# Patient Record
Sex: Female | Born: 1985 | Race: White | Hispanic: No | Marital: Single | Smoking: Never smoker
Health system: Southern US, Community
[De-identification: ages and names within clinical notes are randomized; demographics above are authoritative.]

## PROBLEM LIST (undated history)

## (undated) HISTORY — PX: TONSILLECTOMY: SUR1361

## (undated) HISTORY — PX: CYST REMOVAL HAND: SHX6279

---
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
## 2006-01-09 ENCOUNTER — Other Ambulatory Visit: Admission: RE | Admit: 2006-01-09 | Discharge: 2006-01-09 | Payer: Self-pay | Admitting: Family Medicine

## 2007-02-19 ENCOUNTER — Other Ambulatory Visit: Admission: RE | Admit: 2007-02-19 | Discharge: 2007-02-19 | Payer: Self-pay | Admitting: Family Medicine

## 2008-03-04 ENCOUNTER — Other Ambulatory Visit: Admission: RE | Admit: 2008-03-04 | Discharge: 2008-03-04 | Payer: Self-pay | Admitting: Family Medicine

## 2012-05-29 ENCOUNTER — Encounter: Payer: Self-pay | Admitting: Certified Nurse Midwife

## 2012-06-03 ENCOUNTER — Ambulatory Visit (INDEPENDENT_AMBULATORY_CARE_PROVIDER_SITE_OTHER): Payer: BC Managed Care – PPO | Admitting: Certified Nurse Midwife

## 2012-06-03 ENCOUNTER — Encounter: Payer: Self-pay | Admitting: Certified Nurse Midwife

## 2012-06-03 VITALS — BP 110/64 | Ht 67.25 in | Wt 153.0 lb

## 2012-06-03 DIAGNOSIS — Z01419 Encounter for gynecological examination (general) (routine) without abnormal findings: Secondary | ICD-10-CM

## 2012-06-03 MED ORDER — ETONOGESTREL-ETHINYL ESTRADIOL 0.12-0.015 MG/24HR VA RING: VAGINAL_RING | VAGINAL | Status: DC

## 2012-06-03 NOTE — Patient Instructions (Signed)

## 2012-06-03 NOTE — Progress Notes (Signed)
27 y.o. SingleCaucasian female.    [id number] here for annual exam. Reports no problems with contraception or periods.  No concerns STD's or testing desired. No health issues today.  Working now, finished degree.   Patient's last menstrual period was 05/11/2012.          Sexually active: yes  The current method of family planning is NuvaRing vaginal inserts.    Exercising: yes  cycling Last mammogram: none Last pap :04/28/2010 Last BMD: none Alcohol: 2-3 drinks a week Tobacco: none   Health Maintenance  Topic Date Due  . Influenza Vaccine  11/12/1986  . Pap Smear  10/03/2004  . Tetanus/tdap  03/13/2018    Family History  Problem Relation Age of Onset  . Diabetes Maternal Grandmother     There is no problem list on file for this patient.   History reviewed. No pertinent past medical history.  Past Surgical History  Procedure Laterality Date  . Cyst removal hand Left     age 74  . Tonsillectomy      age 54    Allergies: Keflex and Penicillins  Current Outpatient Prescriptions  Medication Sig Dispense Refill  . etonogestrel-ethinyl estradiol (NUVARING) 0.12-0.015 MG/24HR vaginal ring Place 1 each vaginally every 28 (twenty-eight) days. Insert vaginally and leave in place for 3 consecutive weeks, then remove for 1 week.      . Multiple Vitamins-Minerals (MULTIVITAMIN PO) Take by mouth.      . sertraline (ZOLOFT) 100 MG tablet Take 100 mg by mouth daily.       No current facility-administered medications for this visit.    ROS: A comprehensive review of systems was negative.  Exam:    BP 110/64  Ht 5' 7.25" (1.708 m)  Wt 153 lb (69.4 kg)  BMI 23.79 kg/m2  LMP 05/11/2012 Weight change: @WEIGHTCHANGE @ Last 3 height recordings:  Ht Readings from Last 3 Encounters:  06/03/12 5' 7.25" (1.708 m)   General appearance: alert and cooperative Head: Normocephalic, without obvious abnormality, atraumatic Neck: no adenopathy, supple, symmetrical, trachea midline and thyroid  not enlarged, symmetric, no tenderness/mass/nodules Lungs: clear to auscultation bilaterally Breasts: normal appearance, no masses or tenderness, Inspection negative Heart: regular rate and rhythm Abdomen: soft, non-tender; bowel sounds normal; no masses,  no organomegaly Extremities: extremities normal, atraumatic, no cyanosis or edema Skin: Skin color, texture, turgor normal. No rashes or lesions Lymph nodes: Cervical, supraclavicular, and axillary nodes normal. no inguinal nodes palpated Neurologic: Alert and oriented X 3, normal strength and tone. Normal symmetric reflexes. Normal coordination and gait   Pelvic: External genitalia:  no lesions              Urethra: not indicated and normal appearing urethra with no masses, tenderness or lesions              Bartholins and Skenes: Bartholin's, Urethra, Skene's normal                 Vagina: normal appearing vagina with normal color and discharge, no lesions              Cervix: normal appearance              Pap taken: no        Bimanual Exam:  Uterus:  uterus is normal size, shape, consistency and nontender, anteverted  Adnexa:    not indicated and normal adnexa in size, nontender and no masses                                      Rectovaginal: Confirms                                      Anus:  defer exam  A:1- Well woman exam Contraception- Nuvaring desired     P:Pap smear Rx Nuvaring x12 refills with instructions warning signs and symptoms given return annually or prn  Instructions given  EXERCISE AND DIET:  We recommended that you start or continue a regular exercise program for good health. Regular exercise means any activity that makes your heart beat faster and makes you sweat.  We recommend exercising at least 30 minutes per day at least 3 days a week, preferably 4 or 5.  We also recommend a diet low in fat and sugar.  Inactivity, poor dietary choices and obesity can cause diabetes,  heart attack, stroke, and kidney damage, among others.    ALCOHOL AND SMOKING:  Women should limit their alcohol intake to no more than 7 drinks/beers/glasses of wine (combined, not each!) per week. Moderation of alcohol intake to this level decreases your risk of breast cancer and liver damage. And of course, no recreational drugs are part of a healthy lifestyle.  And absolutely no smoking or even second hand smoke. Most people know smoking can cause heart and lung diseases, but did you know it also contributes to weakening of your bones? Aging of your skin?  Yellowing of your teeth and nails?  CALCIUM AND VITAMIN D:  Adequate intake of calcium and Vitamin D are recommended.  The recommendations for exact amounts of these supplements seem to change often, but generally speaking 600 mg of calcium (either carbonate or citrate) and 800 units of Vitamin D per day seems prudent. Certain women may benefit from higher intake of Vitamin D.  If you are among these women, your doctor will have told you during your visit.    PAP SMEARS:  Pap smears, to check for cervical cancer or precancers,  have traditionally been done yearly, although recent scientific advances have shown that most women can have pap smears less often.  However, every woman still should have a physical exam from her gynecologist every year. It will include a breast check, inspection of the vulva and vagina to check for abnormal growths or skin changes, a visual exam of the cervix, and then an exam to evaluate the size and shape of the uterus and ovaries.  And after 27 years of age, a rectal exam is indicated to check for rectal cancers. We will also provide age appropriate advice regarding health maintenance, like when you should have certain vaccines, screening for sexually transmitted diseases, bone density testing, colonoscopy, mammograms, etc.   MAMMOGRAMS:  All women over 54 years old should have a yearly mammogram. Many facilities now offer  a "3D" mammogram, which may cost around $50 extra out of pocket. If possible,  we recommend you accept the option to have the 3D mammogram performed.  It both reduces the number of women who will be called back for extra views which then turn out to be normal, and it is better than the routine mammogram at detecting  truly abnormal areas.    COLONOSCOPY:  Colonoscopy to screen for colon cancer is recommended for all women at age 73.  We know, you hate the idea of the prep.  We agree, BUT, having colon cancer and not knowing it is worse!!  Colon cancer so often starts as a polyp that can be seen and removed at colonscopy, which can quite literally save your life!  And if your first colonoscopy is normal and you have no family history of colon cancer, most women don't have to have it again for 10 years.  Once every ten years, you can do something that may end up saving your life, right?  We will be happy to help you get it scheduled when you are ready.  Be sure to check your insurance coverage so you understand how much it will cost.  It may be covered as a preventative service at no cost, but you should check your particular policy.       An After Visit Summary was printed and given to the patient.

## 2013-06-04 ENCOUNTER — Ambulatory Visit: Payer: BC Managed Care – PPO | Admitting: Certified Nurse Midwife

## 2013-06-11 ENCOUNTER — Encounter: Payer: Self-pay | Admitting: Certified Nurse Midwife

## 2013-06-11 ENCOUNTER — Ambulatory Visit (INDEPENDENT_AMBULATORY_CARE_PROVIDER_SITE_OTHER): Payer: BC Managed Care – PPO | Admitting: Certified Nurse Midwife

## 2013-06-11 VITALS — BP 122/80 | HR 74 | Resp 12 | Ht 67.0 in | Wt 166.0 lb

## 2013-06-11 DIAGNOSIS — Z01419 Encounter for gynecological examination (general) (routine) without abnormal findings: Secondary | ICD-10-CM

## 2013-06-11 DIAGNOSIS — Z309 Encounter for contraceptive management, unspecified: Secondary | ICD-10-CM

## 2013-06-11 DIAGNOSIS — Z0000 Encounter for general adult medical examination without abnormal findings: Secondary | ICD-10-CM

## 2013-06-11 MED ORDER — ETONOGESTREL-ETHINYL ESTRADIOL 0.12-0.015 MG/24HR VA RING: VAGINAL_RING | VAGINAL | Status: DC

## 2013-06-11 NOTE — Patient Instructions (Signed)
General topics  Next pap or exam is  due in 1 year Take a Women's multivitamin Take 1200 mg. of calcium daily - prefer dietary If any concerns in interim to call back  Breast Self-Awareness Practicing breast self-awareness may pick up problems early, prevent significant medical complications, and possibly save your life. By practicing breast self-awareness, you can become familiar with how your breasts look and feel and if your breasts are changing. This allows you to notice changes early. It can also offer you some reassurance that your breast health is good. One way to learn what is normal for your breasts and whether your breasts are changing is to do a breast self-exam. If you find a lump or something that was not present in the past, it is best to contact your caregiver right away. Other findings that should be evaluated by your caregiver include nipple discharge, especially if it is bloody; skin changes or reddening; areas where the skin seems to be pulled in (retracted); or new lumps and bumps. Breast pain is seldom associated with cancer (malignancy), but should also be evaluated by a caregiver. BREAST SELF-EXAM The best time to examine your breasts is 5 7 days after your menstrual period is over.  ExitCare Patient Information 2013 [employer].   Exercise to Stay Healthy Exercise helps you become and stay healthy. EXERCISE IDEAS AND TIPS Choose exercises that:  You enjoy.  Fit into your day. You do not need to exercise really hard to be healthy. You can do exercises at a slow or medium level and stay healthy. You can:  Stretch before and after working out.  Try yoga, Pilates, or tai chi.  Lift weights.  Walk fast, swim, jog, run, climb stairs, bicycle, dance, or rollerskate.  Take aerobic classes. Exercises that burn about 150 calories:  Running 1  miles in 15 minutes.  Playing volleyball for 45 to 60 minutes.  Washing and waxing a car for 45 to 60  minutes.  Playing touch football for 45 minutes.  Walking 1  miles in 35 minutes.  Pushing a stroller 1  miles in 30 minutes.  Playing basketball for 30 minutes.  Raking leaves for 30 minutes.  Bicycling 5 miles in 30 minutes.  Walking 2 miles in 30 minutes.  Dancing for 30 minutes.  Shoveling snow for 15 minutes.  Swimming laps for 20 minutes.  Walking up stairs for 15 minutes.  Bicycling 4 miles in 15 minutes.  Gardening for 30 to 45 minutes.  Jumping rope for 15 minutes.  Washing windows or floors for 45 to 60 minutes. Document Released: 04/01/2010 Document Revised: 05/22/2011 Document Reviewed: 04/01/2010 [employer] Patient Information 2013 [employer].   Other topics ( that may be useful information):    Sexually Transmitted Disease Sexually transmitted disease (STD) refers to any infection that is passed from person to person during sexual activity. This may happen by way of saliva, semen, blood, vaginal mucus, or urine. Common STDs include:  Gonorrhea.  Chlamydia.  Syphilis.  HIV/AIDS.  Genital herpes.  Hepatitis B and C.  Trichomonas.  Human papillomavirus (HPV).  Pubic lice. CAUSES  An STD may be spread by bacteria, virus, or parasite. A person can get an STD by:  Sexual intercourse with an infected person.  Sharing sex toys with an infected person.  Sharing needles with an infected person.  Having intimate contact with the genitals, mouth, or rectal areas of an infected person. SYMPTOMS  Some people may not have any symptoms, but   not have any symptoms, but they can still pass the infection to others. Different STDs have different symptoms. Symptoms include:  Painful or bloody urination.  Pain in the pelvis, abdomen, vagina, anus, throat, or eyes.  Skin rash, itching, irritation, growths, or sores (lesions). These usually occur in the genital or anal area.  Abnormal vaginal discharge.  Penile discharge in men.  Soft, flesh-colored skin growths in the  genital or anal area.  Fever.  Pain or bleeding during sexual intercourse.  Swollen glands in the groin area.  Yellow skin and eyes (jaundice). This is seen with hepatitis. DIAGNOSIS  To make a diagnosis, your caregiver may:  Take a medical history.  Perform a physical exam.  Take a specimen (culture) to be examined.  Examine a sample of discharge under a microscope.  Perform blood test TREATMENT   Chlamydia, gonorrhea, trichomonas, and syphilis can be cured with antibiotic medicine.  Genital herpes, hepatitis, and HIV can be treated, but not cured, with prescribed medicines. The medicines will lessen the symptoms.  Genital warts from HPV can be treated with medicine or by freezing, burning (electrocautery), or surgery. Warts may come back.  HPV is a virus and cannot be cured with medicine or surgery.However, abnormal areas may be followed very closely by your caregiver and may be removed from the cervix, vagina, or vulva through office procedures or surgery. If your diagnosis is confirmed, your recent sexual partners need treatment. This is true even if they are symptom-free or have a negative culture or evaluation. They should not have sex until their caregiver says it is okay. HOME CARE INSTRUCTIONS  All sexual partners should be informed, tested, and treated for all STDs.  Take your antibiotics as directed. Finish them even if you start to feel better.  Only take over-the-counter or prescription medicines for pain, discomfort, or fever as directed by your caregiver.  Rest.  Eat a balanced diet and drink enough fluids to keep your urine clear or pale yellow.  Do not have sex until treatment is completed and you have followed up with your caregiver. STDs should be checked after treatment.  Keep all follow-up appointments, Pap tests, and blood tests as directed by your caregiver.  Only use latex condoms and water-soluble lubricants during sexual activity. Do not use  petroleum jelly or oils.  Avoid alcohol and illegal drugs.  Get vaccinated for HPV and hepatitis. If you have not received these vaccines in the past, talk to your caregiver about whether one or both might be right for you.  Avoid risky sex practices that can break the skin. The only way to avoid getting an STD is to avoid all sexual activity.Latex condoms and dental dams (for oral sex) will help lessen the risk of getting an STD, but will not completely eliminate the risk. SEEK MEDICAL CARE IF:   You have a fever.  You have any new or worsening symptoms. Document Released: 05/20/2002 Document Revised: 05/22/2011 Document Reviewed: 05/27/2010 [facility] Patient Information 2013 [place].    Domestic Abuse You are being battered or abused if someone close to you hits, pushes, or physically hurts you in any way. You also are being abused if you are forced into activities. You are being sexually abused if you are forced to have sexual contact of any kind. You are being emotionally abused if you are made to feel worthless or if you are constantly threatened. It is important to remember that help is available. No one has the right to  ABUSE  Learn the warning signs of danger. This varies with situations but may include: the use of alcohol, threats, isolation from friends and family, or forced sexual contact. Leave if you feel that violence is going to occur.  If you are attacked or beaten, report it to the police so the abuse is documented. You do not have to press charges. The police can protect you while you or the attackers are leaving. Get the officer's name and badge number and a copy of the report.  Find someone you can trust and tell them what is happening to you: your caregiver, a nurse, clergy member, close friend or family member. Feeling ashamed is natural, but remember that you have done nothing wrong. No one deserves abuse. Document Released:  02/25/2000 Document Revised: 05/22/2011 Document Reviewed: 05/05/2010 [employer] Patient Information 2013 [employer].    How Much is Too Much Alcohol? Drinking too much alcohol can cause injury, accidents, and health problems. These types of problems can include:   Car crashes.  Falls.  Family fighting (domestic violence).  Drowning.  Fights.  Injuries.  Burns.  Damage to certain organs.  Having a baby with birth defects. ONE DRINK CAN BE TOO MUCH WHEN YOU ARE:  Working.  Pregnant or breastfeeding.  Taking medicines. Ask your doctor.  Driving or planning to drive. If you or someone you know has a drinking problem, get help from a doctor.  Document Released: 12/24/2008 Document Revised: 05/22/2011 Document Reviewed: 12/24/2008 [employer] Patient Information 2013 [employer].   Smoking Hazards Smoking cigarettes is extremely bad for your health. Tobacco smoke has over 200 known poisons in it. There are over 60 chemicals in tobacco smoke that cause cancer. Some of the chemicals found in cigarette smoke include:   Cyanide.  Benzene.  Formaldehyde.  Methanol (wood alcohol).  Acetylene (fuel used in welding torches).  Ammonia. Cigarette smoke also contains the poisonous gases nitrogen oxide and carbon monoxide.  Cigarette smokers have an increased risk of many serious medical problems and Smoking causes approximately:  90% of all lung cancer deaths in men.  80% of all lung cancer deaths in women.  90% of deaths from chronic obstructive lung disease. Compared with nonsmokers, smoking increases the risk of:  Coronary heart disease by 2 to 4 times.  Stroke by 2 to 4 times.  Men developing lung cancer by 23 times.  Women developing lung cancer by 13 times.  Dying from chronic obstructive lung diseases by 12 times.  . Smoking is the most preventable cause of death and disease in our society.  WHY IS SMOKING ADDICTIVE?  Nicotine is the chemical  agent in tobacco that is capable of causing addiction or dependence.  When you smoke and inhale, nicotine is absorbed rapidly into the bloodstream through your lungs. Nicotine absorbed through the lungs is capable of creating a powerful addiction. Both inhaled and non-inhaled nicotine may be addictive.  Addiction studies of cigarettes and spit tobacco show that addiction to nicotine occurs mainly during the teen years, when young people begin using tobacco products. WHAT ARE THE BENEFITS OF QUITTING?  There are many health benefits to quitting smoking.   Likelihood of developing cancer and heart disease decreases. Health improvements are seen almost immediately.  Blood pressure, pulse rate, and breathing patterns start returning to normal soon after quitting. QUITTING SMOKING   American Lung Association - 1-800-LUNGUSA  American Cancer Society - 1-800-ACS-2345 Document Released: 04/06/2004 Document Revised: 05/22/2011 Document Reviewed: 12/09/2008 [employer] Patient Information 2013 [employer].   Stress Management Stress is a state of physical or mental tension that often results from changes in your life or normal routine. Some common causes of stress are:  Death of a loved one.  Injuries or severe illnesses.  Getting fired or changing jobs.  Moving into a new home. Other causes may be:  Sexual problems.  Business or financial losses.  Taking on a large debt.  Regular conflict with someone at home or at work.  Constant tiredness from lack of sleep. It is not just bad things that are stressful. It may be stressful to:  Win the lottery.  Get married.  Buy a new car. The amount of stress that can be easily tolerated varies from person to person. Changes generally cause stress, regardless of the types of change. Too much stress can affect your health. It may lead to physical or emotional problems. Too little stress (boredom) may also become stressful. SUGGESTIONS TO  REDUCE STRESS:  Talk things over with your family and friends. It often is helpful to share your concerns and worries. If you feel your problem is serious, you may want to get help from a professional counselor.  Consider your problems one at a time instead of lumping them all together. Trying to take care of everything at once may seem impossible. List all the things you need to do and then start with the most important one. Set a goal to accomplish 2 or 3 things each day. If you expect to do too many in a single day you will naturally fail, causing you to feel even more stressed.  Do not use alcohol or drugs to relieve stress. Although you may feel better for a short time, they do not remove the problems that caused the stress. They can also be habit forming.  Exercise regularly - at least 3 times per week. Physical exercise can help to relieve that "uptight" feeling and will relax you.  The shortest distance between despair and hope is often a good night's sleep.  Go to bed and get up on time allowing yourself time for appointments without being rushed.  Take a short "time-out" period from any stressful situation that occurs during the day. Close your eyes and take some deep breaths. Starting with the muscles in your face, tense them, hold it for a few seconds, then relax. Repeat this with the muscles in your neck, shoulders, hand, stomach, back and legs.  Take good care of yourself. Eat a balanced diet and get plenty of rest.  Schedule time for having fun. Take a break from your daily routine to relax. HOME CARE INSTRUCTIONS   Call if you feel overwhelmed by your problems and feel you can no longer manage them on your own.  Return immediately if you feel like hurting yourself or someone else. Document Released: 08/23/2000 Document Revised: 05/22/2011 Document Reviewed: 04/15/2007 Exit[employer] Patient Information 2013 [employer].   

## 2013-06-11 NOTE — Progress Notes (Signed)
28 y.o. G0P0000 Single Caucasian Fe here for annual exam. Periods normal, using Nuvaring continously then cylces with bleeding every 3 months. New partner, STD screening desired.  Patient's last menstrual period was 04/21/2013.          Sexually active: yes  The current method of family planning is NuvaRing vaginal inserts.    Exercising: yes  cycling 3-4x/wk Smoker:  no  Health Maintenance: Pap:  2012 TDaP:  2012 Labs: Sigmund HazelLisa Miller, MD ; Urine: Declined   reports that she has never smoked. She does not have any smokeless tobacco history on file. She reports that she drinks about 1.0 ounces of alcohol per week.  History reviewed. No pertinent past medical history.  Past Surgical History  Procedure Laterality Date  . Cyst removal hand Left     age 28  . Tonsillectomy      age 89    Current Outpatient Prescriptions  Medication Sig Dispense Refill  . FLUoxetine (PROZAC) 40 MG capsule Take 40 mg by mouth daily.      . Multiple Vitamins-Minerals (MULTIVITAMIN PO) Take by mouth.      . etonogestrel-ethinyl estradiol (NUVARING) 0.12-0.015 MG/24HR vaginal ring Insert vaginally and leave in place for 3 consecutive weeks, then remove for 1 week.  1 each  12   No current facility-administered medications for this visit.    Family History  Problem Relation Age of Onset  . Diabetes Maternal Grandmother     ROS:  Pertinent items are noted in HPI.  Otherwise, a comprehensive ROS was negative.  Exam:   LMP 04/21/2013    Ht Readings from Last 3 Encounters:  06/03/12 5' 7.25" (1.708 m)    General appearance: alert, cooperative and appears stated age Head: Normocephalic, without obvious abnormality, atraumatic Neck: no adenopathy, supple, symmetrical, trachea midline and thyroid normal to inspection and palpation and non-palpable Lungs: clear to auscultation bilaterally Breasts: normal appearance, no masses or tenderness, No nipple retraction or dimpling, No nipple discharge or  bleeding, No axillary or supraclavicular adenopathy Heart: regular rate and rhythm Abdomen: soft, non-tender; no masses,  no organomegaly Extremities: extremities normal, atraumatic, no cyanosis or edema Skin: Skin color, texture, turgor normal. No rashes or lesions Lymph nodes: Cervical, supraclavicular, and axillary nodes normal. No abnormal inguinal nodes palpated Neurologic: Grossly normal   Pelvic: External genitalia:  no lesions              Urethra:  normal appearing urethra with no masses, tenderness or lesions              Bartholin's and Skene's: normal                 Vagina: normal appearing vagina with normal color and discharge, no lesions              Cervix: normal, non tender bleeding with pap only              Pap taken: yes Bimanual Exam:  Uterus:  normal size, contour, position, consistency, mobility, non-tender and anteverted              Adnexa: normal adnexa and no mass, fullness, tenderness               Rectovaginal: Confirms               Anus:  Deferred  A:  Well Woman with normal exam  Contraception Nuvaring desired  STD screening  Anxiety/depression on medication with PCP management  Counseling  needed, using alcohol to self medicate with occasional marijuana use  P:   Reviewed health and wellness pertinent to exam  Rx Niuvaring see order  Lab: GC,Chlamydia,HIV,RPR  Discussed notifying PCP of status change and medication not working as well  Given J.Whitt information and stressed importance of calling. Discussed self  medication and danger of using other things with Prozac. Patient aware, denies thought of self harm or others. Instructed to seek 911 if occurs.  Lab:TSH, Vitamin D  Pap smear as per guidelines   pap smear taken today with reflex  counseled on breast self exam, STD prevention, HIV risk factors and prevention, adequate intake of calcium and vitamin D, diet and exercise  return annually or prn  An After Visit Summary was printed and  given to the patient.

## 2013-06-12 LAB — TSH: TSH: 2.035 u[IU]/mL (ref 0.350–4.500)

## 2013-06-12 LAB — IPS PAP TEST WITH REFLEX TO HPV

## 2013-06-12 LAB — HIV ANTIBODY (ROUTINE TESTING W REFLEX): HIV: NONREACTIVE

## 2013-06-12 LAB — VITAMIN D 25 HYDROXY (VIT D DEFICIENCY, FRACTURES): VIT D 25 HYDROXY: 26 ng/mL — AB (ref 30–89)

## 2013-06-12 LAB — RPR

## 2013-06-13 LAB — IPS N GONORRHOEA AND CHLAMYDIA BY PCR

## 2013-06-16 ENCOUNTER — Telehealth: Payer: Self-pay

## 2013-06-16 NOTE — Telephone Encounter (Signed)
Message copied by Eliezer BottomJOHNSON, DAVINA J on Mon Jun 16, 2013 10:12 AM ------      Message from: Ria CommentGRUBB, PATRICIA R      Created: Mon Jun 16, 2013  8:38 AM       Let patient know HIV,STS, GC, CHL is all normal.  Pap 02.  Also TSH is normal but Vit D is low - follow protocol. ------

## 2013-06-16 NOTE — Telephone Encounter (Signed)
No voicemail try later

## 2013-06-17 NOTE — Telephone Encounter (Signed)
Left message for call back.

## 2013-06-17 NOTE — Progress Notes (Signed)
Reviewed personally.  M. Suzanne Cheyan Frees, MD.  

## 2013-06-20 NOTE — Telephone Encounter (Signed)
Patient notified of results. See lab 

## 2014-06-16 ENCOUNTER — Other Ambulatory Visit: Payer: Self-pay | Admitting: Certified Nurse Midwife

## 2014-06-16 NOTE — Telephone Encounter (Signed)
Medication refill request: Nuvaring  Last AEX:  06/11/13 with DL  Next AEX: 1/6/104/7/16 with DL  Last MMG (if hormonal medication request): N/A Refill authorized: #1 ring/0 rfs, please advise.

## 2014-06-18 ENCOUNTER — Ambulatory Visit (INDEPENDENT_AMBULATORY_CARE_PROVIDER_SITE_OTHER): Payer: PRIVATE HEALTH INSURANCE | Admitting: Certified Nurse Midwife

## 2014-06-18 ENCOUNTER — Encounter: Payer: Self-pay | Admitting: Certified Nurse Midwife

## 2014-06-18 VITALS — BP 112/70 | HR 70 | Resp 16 | Ht 67.25 in | Wt 175.0 lb

## 2014-06-18 DIAGNOSIS — Z01419 Encounter for gynecological examination (general) (routine) without abnormal findings: Secondary | ICD-10-CM

## 2014-06-18 DIAGNOSIS — Z3049 Encounter for surveillance of other contraceptives: Secondary | ICD-10-CM | POA: Diagnosis not present

## 2014-06-18 MED ORDER — ETONOGESTREL-ETHINYL ESTRADIOL 0.12-0.015 MG/24HR VA RING: VAGINAL_RING | VAGINAL | Status: DC

## 2014-06-18 NOTE — Patient Instructions (Signed)
General topics  Next pap or exam is  due in 1 year Take a Women's multivitamin Take 1200 mg. of calcium daily - prefer dietary If any concerns in interim to call back  Breast Self-Awareness Practicing breast self-awareness may pick up problems early, prevent significant medical complications, and possibly save your life. By practicing breast self-awareness, you can become familiar with how your breasts look and feel and if your breasts are changing. This allows you to notice changes early. It can also offer you some reassurance that your breast health is good. One way to learn what is normal for your breasts and whether your breasts are changing is to do a breast self-exam. If you find a lump or something that was not present in the past, it is best to contact your caregiver right away. Other findings that should be evaluated by your caregiver include nipple discharge, especially if it is bloody; skin changes or reddening; areas where the skin seems to be pulled in (retracted); or new lumps and bumps. Breast pain is seldom associated with cancer (malignancy), but should also be evaluated by a caregiver. BREAST SELF-EXAM The best time to examine your breasts is 5 7 days after your menstrual period is over.  [employer] Patient Information 2013 [place].   Exercise to Stay Healthy Exercise helps you become and stay healthy. EXERCISE IDEAS AND TIPS Choose exercises that:  You enjoy.  Fit into your day. You do not need to exercise really hard to be healthy. You can do exercises at a slow or medium level and stay healthy. You can:  Stretch before and after working out.  Try yoga, Pilates, or tai chi.  Lift weights.  Walk fast, swim, jog, run, climb stairs, bicycle, dance, or rollerskate.  Take aerobic classes. Exercises that burn about 150 calories:  Running 1  miles in 15 minutes.  Playing volleyball for 45 to 60 minutes.  Washing and waxing a car for 45 to 60  minutes.  Playing touch football for 45 minutes.  Walking 1  miles in 35 minutes.  Pushing a stroller 1  miles in 30 minutes.  Playing basketball for 30 minutes.  Raking leaves for 30 minutes.  Bicycling 5 miles in 30 minutes.  Walking 2 miles in 30 minutes.  Dancing for 30 minutes.  Shoveling snow for 15 minutes.  Swimming laps for 20 minutes.  Walking up stairs for 15 minutes.  Bicycling 4 miles in 15 minutes.  Gardening for 30 to 45 minutes.  Jumping rope for 15 minutes.  Washing windows or floors for 45 to 60 minutes. Document Released: 04/01/2010 Document Revised: 05/22/2011 Document Reviewed: 04/01/2010 [facility] Patient Information 2013 [place].   Other topics ( that may be useful information):    Sexually Transmitted Disease Sexually transmitted disease (STD) refers to any infection that is passed from person to person during sexual activity. This may happen by way of saliva, semen, blood, vaginal mucus, or urine. Common STDs include:  Gonorrhea.  Chlamydia.  Syphilis.  HIV/AIDS.  Genital herpes.  Hepatitis B and C.  Trichomonas.  Human papillomavirus (HPV).  Pubic lice. CAUSES  An STD may be spread by bacteria, virus, or parasite. A person can get an STD by:  Sexual intercourse with an infected person.  Sharing sex toys with an infected person.  Sharing needles with an infected person.  Having intimate contact with the genitals, mouth, or rectal areas of an infected person. SYMPTOMS  Some people may not have any symptoms, but  they can still pass the infection to others. Different STDs have different symptoms. Symptoms include:  Painful or bloody urination.  Pain in the pelvis, abdomen, vagina, anus, throat, or eyes.  Skin rash, itching, irritation, growths, or sores (lesions). These usually occur in the genital or anal area.  Abnormal vaginal discharge.  Penile discharge in men.  Soft, flesh-colored skin growths in the  genital or anal area.  Fever.  Pain or bleeding during sexual intercourse.  Swollen glands in the groin area.  Yellow skin and eyes (jaundice). This is seen with hepatitis. DIAGNOSIS  To make a diagnosis, your caregiver may:  Take a medical history.  Perform a physical exam.  Take a specimen (culture) to be examined.  Examine a sample of discharge under a microscope.  Perform blood test TREATMENT   Chlamydia, gonorrhea, trichomonas, and syphilis can be cured with antibiotic medicine.  Genital herpes, hepatitis, and HIV can be treated, but not cured, with prescribed medicines. The medicines will lessen the symptoms.  Genital warts from HPV can be treated with medicine or by freezing, burning (electrocautery), or surgery. Warts may come back.  HPV is a virus and cannot be cured with medicine or surgery.However, abnormal areas may be followed very closely by your caregiver and may be removed from the cervix, vagina, or vulva through office procedures or surgery. If your diagnosis is confirmed, your recent sexual partners need treatment. This is true even if they are symptom-free or have a negative culture or evaluation. They should not have sex until their caregiver says it is okay. HOME CARE INSTRUCTIONS  All sexual partners should be informed, tested, and treated for all STDs.  Take your antibiotics as directed. Finish them even if you start to feel better.  Only take over-the-counter or prescription medicines for pain, discomfort, or fever as directed by your caregiver.  Rest.  Eat a balanced diet and drink enough fluids to keep your urine clear or pale yellow.  Do not have sex until treatment is completed and you have followed up with your caregiver. STDs should be checked after treatment.  Keep all follow-up appointments, Pap tests, and blood tests as directed by your caregiver.  Only use latex condoms and water-soluble lubricants during sexual activity. Do not use  petroleum jelly or oils.  Avoid alcohol and illegal drugs.  Get vaccinated for HPV and hepatitis. If you have not received these vaccines in the past, talk to your caregiver about whether one or both might be right for you.  Avoid risky sex practices that can break the skin. The only way to avoid getting an STD is to avoid all sexual activity.Latex condoms and dental dams (for oral sex) will help lessen the risk of getting an STD, but will not completely eliminate the risk. SEEK MEDICAL CARE IF:   You have a fever.  You have any new or worsening symptoms. Document Released: 05/20/2002 Document Revised: 05/22/2011 Document Reviewed: 05/27/2010 [facility] Patient Information 2013 [place].    Domestic Abuse You are being battered or abused if someone close to you hits, pushes, or physically hurts you in any way. You also are being abused if you are forced into activities. You are being sexually abused if you are forced to have sexual contact of any kind. You are being emotionally abused if you are made to feel worthless or if you are constantly threatened. It is important to remember that help is available. No one has the right to abuse you. PREVENTION OF FURTHER  ABUSE  Learn the warning signs of danger. This varies with situations but may include: the use of alcohol, threats, isolation from friends and family, or forced sexual contact. Leave if you feel that violence is going to occur.  If you are attacked or beaten, report it to the police so the abuse is documented. You do not have to press charges. The police can protect you while you or the attackers are leaving. Get the officer's name and badge number and a copy of the report.  Find someone you can trust and tell them what is happening to you: your caregiver, a nurse, clergy member, close friend or family member. Feeling ashamed is natural, but remember that you have done nothing wrong. No one deserves abuse. Document Released:  02/25/2000 Document Revised: 05/22/2011 Document Reviewed: 05/05/2010 [facility] Patient Information 2013 [place].    How Much is Too Much Alcohol? Drinking too much alcohol can cause injury, accidents, and health problems. These types of problems can include:   Car crashes.  Falls.  Family fighting (domestic violence).  Drowning.  Fights.  Injuries.  Burns.  Damage to certain organs.  Having a baby with birth defects. ONE DRINK CAN BE TOO MUCH WHEN YOU ARE:  Working.  Pregnant or breastfeeding.  Taking medicines. Ask your doctor.  Driving or planning to drive. If you or someone you know has a drinking problem, get help from a doctor.  Document Released: 12/24/2008 Document Revised: 05/22/2011 Document Reviewed: 12/24/2008 [facility] Patient Information 2013 [place].   Smoking Hazards Smoking cigarettes is extremely bad for your health. Tobacco smoke has over 200 known poisons in it. There are over 60 chemicals in tobacco smoke that cause cancer. Some of the chemicals found in cigarette smoke include:   Cyanide.  Benzene.  Formaldehyde.  Methanol (wood alcohol).  Acetylene (fuel used in welding torches).  Ammonia. Cigarette smoke also contains the poisonous gases nitrogen oxide and carbon monoxide.  Cigarette smokers have an increased risk of many serious medical problems and Smoking causes approximately:  90% of all lung cancer deaths in men.  80% of all lung cancer deaths in women.  90% of deaths from chronic obstructive lung disease. Compared with nonsmokers, smoking increases the risk of:  Coronary heart disease by 2 to 4 times.  Stroke by 2 to 4 times.  Men developing lung cancer by 23 times.  Women developing lung cancer by 13 times.  Dying from chronic obstructive lung diseases by 12 times.  . Smoking is the most preventable cause of death and disease in our society.  WHY IS SMOKING ADDICTIVE?  Nicotine is the chemical  agent in tobacco that is capable of causing addiction or dependence.  When you smoke and inhale, nicotine is absorbed rapidly into the bloodstream through your lungs. Nicotine absorbed through the lungs is capable of creating a powerful addiction. Both inhaled and non-inhaled nicotine may be addictive.  Addiction studies of cigarettes and spit tobacco show that addiction to nicotine occurs mainly during the teen years, when young people begin using tobacco products. WHAT ARE THE BENEFITS OF QUITTING?  There are many health benefits to quitting smoking.   Likelihood of developing cancer and heart disease decreases. Health improvements are seen almost immediately.  Blood pressure, pulse rate, and breathing patterns start returning to normal soon after quitting. QUITTING SMOKING   American Lung Association - 1-800-LUNGUSA  American Cancer Society - 1-800-ACS-2345 Document Released: 04/06/2004 Document Revised: 05/22/2011 Document Reviewed: 12/09/2008 [facility] Patient Information 2013 [place].   Stress Management Stress is a state of physical or mental tension that often results from changes in your life or normal routine. Some common causes of stress are:  Death of a loved one.  Injuries or severe illnesses.  Getting fired or changing jobs.  Moving into a new home. Other causes may be:  Sexual problems.  Business or financial losses.  Taking on a large debt.  Regular conflict with someone at home or at work.  Constant tiredness from lack of sleep. It is not just bad things that are stressful. It may be stressful to:  Win the lottery.  Get married.  Buy a new car. The amount of stress that can be easily tolerated varies from person to person. Changes generally cause stress, regardless of the types of change. Too much stress can affect your health. It may lead to physical or emotional problems. Too little stress (boredom) may also become stressful. SUGGESTIONS TO  REDUCE STRESS:  Talk things over with your family and friends. It often is helpful to share your concerns and worries. If you feel your problem is serious, you may want to get help from a professional counselor.  Consider your problems one at a time instead of lumping them all together. Trying to take care of everything at once may seem impossible. List all the things you need to do and then start with the most important one. Set a goal to accomplish 2 or 3 things each day. If you expect to do too many in a single day you will naturally fail, causing you to feel even more stressed.  Do not use alcohol or drugs to relieve stress. Although you may feel better for a short time, they do not remove the problems that caused the stress. They can also be habit forming.  Exercise regularly - at least 3 times per week. Physical exercise can help to relieve that "uptight" feeling and will relax you.  The shortest distance between despair and hope is often a good night's sleep.  Go to bed and get up on time allowing yourself time for appointments without being rushed.  Take a short "time-out" period from any stressful situation that occurs during the day. Close your eyes and take some deep breaths. Starting with the muscles in your face, tense them, hold it for a few seconds, then relax. Repeat this with the muscles in your neck, shoulders, hand, stomach, back and legs.  Take good care of yourself. Eat a balanced diet and get plenty of rest.  Schedule time for having fun. Take a break from your daily routine to relax. HOME CARE INSTRUCTIONS   Call if you feel overwhelmed by your problems and feel you can no longer manage them on your own.  Return immediately if you feel like hurting yourself or someone else. Document Released: 08/23/2000 Document Revised: 05/22/2011 Document Reviewed: 04/15/2007 [facility] Patient Information 2013 [place].

## 2014-06-18 NOTE — Progress Notes (Signed)
Reviewed personally.  M. Suzanne Sven Pinheiro, MD.  

## 2014-06-18 NOTE — Progress Notes (Signed)
29 y.o. G0P0000 Single  Caucasian Fe here for annual exam.  Periods normal, no issues. Contraception working well. Not currently sexually active, no STD screen desired. Working on weight loss again, has stopped antidepressants. Seeing therapist and this is helping. Has Xanax as rescue medication once weekly. Sees PCP for aex, medication management, and labs. No health issues today.  Patient's last menstrual period was 05/26/2014.          Sexually active: Yes.    The current method of family planning is NuvaRing vaginal inserts.    Exercising: Yes.    biking Smoker:  no  Health Maintenance: Pap: 06-11-13 neg MMG: none Colonoscopy:  none BMD:   none TDaP:  2012 Labs: none Self breast exam: done monthly   reports that she has never smoked. She does not have any smokeless tobacco history on file. She reports that she drinks about 3.0 oz of alcohol per week. She reports that she does not use illicit drugs.  History reviewed. No pertinent past medical history.  Past Surgical History  Procedure Laterality Date  . Cyst removal hand Left     age 29  . Tonsillectomy      age 29    Current Outpatient Prescriptions  Medication Sig Dispense Refill  . ALPRAZolam (XANAX) 0.25 MG tablet as needed.     . fluticasone (FLONASE) 50 MCG/ACT nasal spray Place into both nostrils daily.    . Multiple Vitamins-Minerals (MULTIVITAMIN PO) Take by mouth.    Marland Kitchen. NUVARING 0.12-0.015 MG/24HR vaginal ring insert vaginally and leave in place for 3 consecutive weeks, then remove for 1 week 1 each 0   No current facility-administered medications for this visit.    Family History  Problem Relation Age of Onset  . Diabetes Maternal Grandmother     ROS:  Pertinent items are noted in HPI.  Otherwise, a comprehensive ROS was negative.  Exam:   BP 112/70 mmHg  Pulse 70  Resp 16  Ht 5' 7.25" (1.708 m)  Wt 175 lb (79.379 kg)  BMI 27.21 kg/m2  LMP 05/26/2014 Height: 5' 7.25" (170.8 cm) Ht Readings from Last 3  Encounters:  06/18/14 5' 7.25" (1.708 m)  06/11/13 5\' 7"  (1.702 m)  06/03/12 5' 7.25" (1.708 m)    General appearance: alert, cooperative and appears stated age Head: Normocephalic, without obvious abnormality, atraumatic Neck: no adenopathy, supple, symmetrical, trachea midline and thyroid normal to inspection and palpation Lungs: clear to auscultation bilaterally Breasts: normal appearance, no masses or tenderness, No nipple retraction or dimpling, No nipple discharge or bleeding, No axillary or supraclavicular adenopathy Heart: regular rate and rhythm Abdomen: soft, non-tender; no masses,  no organomegaly Extremities: extremities normal, atraumatic, no cyanosis or edema Skin: Skin color, texture, turgor normal. No rashes or lesions Lymph nodes: Cervical, supraclavicular, and axillary nodes normal. No abnormal inguinal nodes palpated Neurologic: Grossly normal   Pelvic: External genitalia:  no lesions              Urethra:  normal appearing urethra with no masses, tenderness or lesions              Bartholin's and Skene's: normal                 Vagina: normal appearing vagina with normal color and discharge, no lesions              Cervix: normal, non tender, no lesions              Pap  taken: Yes.   Bimanual Exam:  Uterus:  normal size, contour, position, consistency, mobility, non-tender              Adnexa: normal adnexa and no mass, fullness, tenderness               Rectovaginal: Confirms               Anus:  normal   Chaperone present: Yes  A:  Well Woman with normal exam  Contraception Nuvaring desired    P:   Reviewed health and wellness pertinent to exam  Rx Nuvaring see order  Pap smear not taken today   counseled on breast self exam, STD prevention, HIV risk factors and prevention, adequate intake of calcium and vitamin D, diet and exercise  return annually or prn  An After Visit Summary was printed and given to the patient.

## 2014-07-11 ENCOUNTER — Other Ambulatory Visit: Payer: Self-pay | Admitting: Certified Nurse Midwife

## 2014-07-13 NOTE — Telephone Encounter (Signed)
Medication refill request: Nuvaring  Last AEX:  06/18/14 DL Next AEX: 1/61/094/13/17 DL Last MMG (if hormonal medication request): none Refill authorized: 06/18/14 #1each / 12R to [facility] [place]

## 2015-01-29 ENCOUNTER — Ambulatory Visit (INDEPENDENT_AMBULATORY_CARE_PROVIDER_SITE_OTHER): Payer: PRIVATE HEALTH INSURANCE | Admitting: Obstetrics & Gynecology

## 2015-01-29 VITALS — BP 128/70 | HR 72 | Resp 16 | Wt 176.0 lb

## 2015-01-29 DIAGNOSIS — Z3009 Encounter for other general counseling and advice on contraception: Secondary | ICD-10-CM

## 2015-01-31 ENCOUNTER — Encounter: Payer: Self-pay | Admitting: Obstetrics & Gynecology

## 2015-01-31 NOTE — Progress Notes (Signed)
Patient ID: Danielle Allen, female   DOB: [birth year], 29 y.o.   MRN: [id number]  29 yo G0 SWF here for discussion of possible IUD usage.  Pt has no desires to have children.  Is SA and in long term relationship.  Pt has been on Nuva ring with success but desirous of more long term contraception.  Pt would like choice to help with flow, as well, if possible.    Different IUDs options discussed.  Pt not interested in copper IUD due to typical increases in length and heaviness of flow.  Pt also would prefer five vs three years for IUD if possible.  Mirena is pt's preference.    H/O depression and anxiety but I do not feel the change to progesterone IUD will cause worsening symptoms due to low level of hormone in blood stream with Mirena IUD.  Placement with risks and benefits reviewed.  Specifically, reviewed risks of imbedded IUD, uterine perforation, risk of pregnancy and ectopic risk.  Irregular bleeding and cramping also reviewed.  Reviewed schedule for placement.  Pt will manipulate her Nuva ring to be on cycle week of IUD placement.  Aware if cycle starts early, she should call.  Pt's questions were all answered.  Assessment:  Change in contraception desired  Plan:  Pt scheduled for IUD placement.  Precert will be done.  All questions answered.    ~20 minutes spent with patient >50% of time was in face to face discussion of above.

## 2015-03-05 ENCOUNTER — Encounter: Payer: Self-pay | Admitting: Obstetrics & Gynecology

## 2015-03-05 ENCOUNTER — Other Ambulatory Visit: Payer: Self-pay

## 2015-03-05 ENCOUNTER — Ambulatory Visit (INDEPENDENT_AMBULATORY_CARE_PROVIDER_SITE_OTHER): Payer: PRIVATE HEALTH INSURANCE | Admitting: Obstetrics & Gynecology

## 2015-03-05 VITALS — BP 118/80 | HR 92 | Resp 16 | Ht 67.25 in | Wt 177.0 lb

## 2015-03-05 DIAGNOSIS — Z30430 Encounter for insertion of intrauterine contraceptive device: Secondary | ICD-10-CM

## 2015-03-05 DIAGNOSIS — Z975 Presence of (intrauterine) contraceptive device: Secondary | ICD-10-CM | POA: Diagnosis not present

## 2015-03-05 HISTORY — PX: INTRAUTERINE DEVICE (IUD) INSERTION: SHX5877

## 2015-03-05 NOTE — Progress Notes (Signed)
29 y.o. G0P0000 Single Caucasian female who is in a long term relationship presents for insertion of IUD.  She would like a Mirena but we are going to decide today based on cavity length.   Last GC/Chl was 4/15 but pt has no STD concerns due to current long term relationship.  She has been counseled about alternative forms of birth control including oral contraceptives, progesterone methods, IUD, barrier method, and sterilization.  She has decided to proceed with IUD placement.  Currently, she denies any vaginal symptoms or STD concerns.  LMP:  Patient's last menstrual period was 03/05/2015.  Removed Nuva ring on 03/02/15.    Gen:  WNWF healthy female NAD Abdomen: soft, non-tender Groin: no inguinal nodes palpated  Pelvic exam: Vulva:  normal female genitalia Vagina:  normal vagina Cervix:  Non-tender, Negative CMT, no lesions or redness Uterus:  normal shape, position and consistency   After patient read information booklet and all questions were answered, informed consent was obtained.      Procedure:  Speculum inserted into vagina. Cervix visualized and cleansed with betadine solution X 3. Paracervical block placed:  no.   Tenaculum placed on cervix at 12 o'clock position.  Uterus sounded to 7 centimeters.  IUD and inserting device removed from sterile packet and under sterile conditions inserted to fundus of uterus.  IUD released and introducer removed without difficulty.  IUD string trimmed to 2 centimeters.  Remainder string given to patient to feel for identification.  Tenaculum removed.  minimal bleeding noted.  Speculum removed.  Uterus palpated normal.  Patient tolerated procedure well.  IUD Lot #:[id number].  Exp: 6/19.  Package information attached to consent and scanned into EPIC.  A: Insertion of Mirena IUD  P: Return to office 4-6 weeks for recheck      Pt knows IUD needs to be replaced approximately 5 years, no later than 03/04/20.  Instructions provided.

## 2015-03-11 ENCOUNTER — Ambulatory Visit: Payer: PRIVATE HEALTH INSURANCE | Admitting: Obstetrics & Gynecology

## 2015-04-16 ENCOUNTER — Encounter: Payer: Self-pay | Admitting: Obstetrics & Gynecology

## 2015-04-16 ENCOUNTER — Ambulatory Visit (INDEPENDENT_AMBULATORY_CARE_PROVIDER_SITE_OTHER): Payer: PRIVATE HEALTH INSURANCE | Admitting: Obstetrics & Gynecology

## 2015-04-16 VITALS — BP 128/60 | HR 104 | Resp 16 | Ht 67.25 in | Wt 177.0 lb

## 2015-04-16 DIAGNOSIS — Z30431 Encounter for routine checking of intrauterine contraceptive device: Secondary | ICD-10-CM | POA: Diagnosis not present

## 2015-04-22 ENCOUNTER — Encounter: Payer: Self-pay | Admitting: Obstetrics & Gynecology

## 2015-04-22 NOTE — Progress Notes (Signed)
Subjective:     Patient ID: Danielle Allen, female   DOB: 03/08/1986, 30 y.o.   MRN: [id number]  HPI 30 yo G0 SWF here for follow up after Mirena IUD insertion.  Reports minimal spotting or cramping.  Really happy with decision.  Can feel strings.  Took Motrin for a couple of days after placement.  No abnormal discharge.  Review of Systems  All other systems reviewed and are negative.      Objective:   Physical Exam  Constitutional: She appears well-developed and well-nourished.  Abdominal: Soft. Bowel sounds are normal.  Genitourinary: Vagina normal. There is no rash, tenderness, lesion or injury on the right labia. There is no rash, tenderness, lesion or injury on the left labia.  2cm IUD string noted.  No CMT.  Lymphadenopathy:       Right: No inguinal adenopathy present.       Left: No inguinal adenopathy present.  Skin: Skin is warm and dry.  Psychiatric: She has a normal mood and affect.       Assessment:     S/P Mirena placement 03/05/15    Plan:     Return for AEX or if has any new issues/concerns

## 2015-06-24 ENCOUNTER — Ambulatory Visit (INDEPENDENT_AMBULATORY_CARE_PROVIDER_SITE_OTHER): Payer: PRIVATE HEALTH INSURANCE | Admitting: Certified Nurse Midwife

## 2015-06-24 ENCOUNTER — Encounter: Payer: Self-pay | Admitting: Certified Nurse Midwife

## 2015-06-24 VITALS — BP 100/64 | HR 70 | Resp 16 | Ht 67.5 in | Wt 178.0 lb

## 2015-06-24 DIAGNOSIS — Z01419 Encounter for gynecological examination (general) (routine) without abnormal findings: Secondary | ICD-10-CM

## 2015-06-24 NOTE — Patient Instructions (Signed)
General topics  Next pap or exam is  due in 1 year Take a Women's multivitamin Take 1200 mg. of calcium daily - prefer dietary If any concerns in interim to call back  Breast Self-Awareness Practicing breast self-awareness may pick up problems early, prevent significant medical complications, and possibly save your life. By practicing breast self-awareness, you can become familiar with how your breasts look and feel and if your breasts are changing. This allows you to notice changes early. It can also offer you some reassurance that your breast health is good. One way to learn what is normal for your breasts and whether your breasts are changing is to do a breast self-exam. If you find a lump or something that was not present in the past, it is best to contact your caregiver right away. Other findings that should be evaluated by your caregiver include nipple discharge, especially if it is bloody; skin changes or reddening; areas where the skin seems to be pulled in (retracted); or new lumps and bumps. Breast pain is seldom associated with cancer (malignancy), but should also be evaluated by a caregiver. BREAST SELF-EXAM The best time to examine your breasts is 5 7 days after your menstrual period is over.  ExitCare Patient Information 2013 [employer].   Exercise to Stay Healthy Exercise helps you become and stay healthy. EXERCISE IDEAS AND TIPS Choose exercises that:  You enjoy.  Fit into your day. You do not need to exercise really hard to be healthy. You can do exercises at a slow or medium level and stay healthy. You can:  Stretch before and after working out.  Try yoga, Pilates, or tai chi.  Lift weights.  Walk fast, swim, jog, run, climb stairs, bicycle, dance, or rollerskate.  Take aerobic classes. Exercises that burn about 150 calories:  Running 1  miles in 15 minutes.  Playing volleyball for 45 to 60 minutes.  Washing and waxing a car for 45 to 60  minutes.  Playing touch football for 45 minutes.  Walking 1  miles in 35 minutes.  Pushing a stroller 1  miles in 30 minutes.  Playing basketball for 30 minutes.  Raking leaves for 30 minutes.  Bicycling 5 miles in 30 minutes.  Walking 2 miles in 30 minutes.  Dancing for 30 minutes.  Shoveling snow for 15 minutes.  Swimming laps for 20 minutes.  Walking up stairs for 15 minutes.  Bicycling 4 miles in 15 minutes.  Gardening for 30 to 45 minutes.  Jumping rope for 15 minutes.  Washing windows or floors for 45 to 60 minutes. Document Released: 04/01/2010 Document Revised: 05/22/2011 Document Reviewed: 04/01/2010 ExitCare Patient Information 2013 [employer].   Other topics ( that may be useful information):    Sexually Transmitted Disease Sexually transmitted disease (STD) refers to any infection that is passed from person to person during sexual activity. This may happen by way of saliva, semen, blood, vaginal mucus, or urine. Common STDs include:  Gonorrhea.  Chlamydia.  Syphilis.  HIV/AIDS.  Genital herpes.  Hepatitis B and C.  Trichomonas.  Human papillomavirus (HPV).  Pubic lice. CAUSES  An STD may be spread by bacteria, virus, or parasite. A person can get an STD by:  Sexual intercourse with an infected person.  Sharing sex toys with an infected person.  Sharing needles with an infected person.  Having intimate contact with the genitals, mouth, or rectal areas of an infected person. SYMPTOMS  Some people may not have any symptoms, but   they can still pass the infection to others. Different STDs have different symptoms. Symptoms include:  Painful or bloody urination.  Pain in the pelvis, abdomen, vagina, anus, throat, or eyes.  Skin rash, itching, irritation, growths, or sores (lesions). These usually occur in the genital or anal area.  Abnormal vaginal discharge.  Penile discharge in men.  Soft, flesh-colored skin growths in the  genital or anal area.  Fever.  Pain or bleeding during sexual intercourse.  Swollen glands in the groin area.  Yellow skin and eyes (jaundice). This is seen with hepatitis. DIAGNOSIS  To make a diagnosis, your caregiver may:  Take a medical history.  Perform a physical exam.  Take a specimen (culture) to be examined.  Examine a sample of discharge under a microscope.  Perform blood test TREATMENT   Chlamydia, gonorrhea, trichomonas, and syphilis can be cured with antibiotic medicine.  Genital herpes, hepatitis, and HIV can be treated, but not cured, with prescribed medicines. The medicines will lessen the symptoms.  Genital warts from HPV can be treated with medicine or by freezing, burning (electrocautery), or surgery. Warts may come back.  HPV is a virus and cannot be cured with medicine or surgery.However, abnormal areas may be followed very closely by your caregiver and may be removed from the cervix, vagina, or vulva through office procedures or surgery. If your diagnosis is confirmed, your recent sexual partners need treatment. This is true even if they are symptom-free or have a negative culture or evaluation. They should not have sex until their caregiver says it is okay. HOME CARE INSTRUCTIONS  All sexual partners should be informed, tested, and treated for all STDs.  Take your antibiotics as directed. Finish them even if you start to feel better.  Only take over-the-counter or prescription medicines for pain, discomfort, or fever as directed by your caregiver.  Rest.  Eat a balanced diet and drink enough fluids to keep your urine clear or pale yellow.  Do not have sex until treatment is completed and you have followed up with your caregiver. STDs should be checked after treatment.  Keep all follow-up appointments, Pap tests, and blood tests as directed by your caregiver.  Only use latex condoms and water-soluble lubricants during sexual activity. Do not use  petroleum jelly or oils.  Avoid alcohol and illegal drugs.  Get vaccinated for HPV and hepatitis. If you have not received these vaccines in the past, talk to your caregiver about whether one or both might be right for you.  Avoid risky sex practices that can break the skin. The only way to avoid getting an STD is to avoid all sexual activity.Latex condoms and dental dams (for oral sex) will help lessen the risk of getting an STD, but will not completely eliminate the risk. SEEK MEDICAL CARE IF:   You have a fever.  You have any new or worsening symptoms. Document Released: 05/20/2002 Document Revised: 05/22/2011 Document Reviewed: 05/27/2010 [facility] Patient Information 2013 [employer].    Domestic Abuse You are being battered or abused if someone close to you hits, pushes, or physically hurts you in any way. You also are being abused if you are forced into activities. You are being sexually abused if you are forced to have sexual contact of any kind. You are being emotionally abused if you are made to feel worthless or if you are constantly threatened. It is important to remember that help is available. No one has the right to abuse you. PREVENTION OF FURTHER  ABUSE  Learn the warning signs of danger. This varies with situations but may include: the use of alcohol, threats, isolation from friends and family, or forced sexual contact. Leave if you feel that violence is going to occur.  If you are attacked or beaten, report it to the police so the abuse is documented. You do not have to press charges. The police can protect you while you or the attackers are leaving. Get the officer's name and badge number and a copy of the report.  Find someone you can trust and tell them what is happening to you: your caregiver, a nurse, clergy member, close friend or family member. Feeling ashamed is natural, but remember that you have done nothing wrong. No one deserves abuse. Document Released:  02/25/2000 Document Revised: 05/22/2011 Document Reviewed: 05/05/2010 ExitCare Patient Information 2013 [employer].    How Much is Too Much Alcohol? Drinking too much alcohol can cause injury, accidents, and health problems. These types of problems can include:   Car crashes.  Falls.  Family fighting (domestic violence).  Drowning.  Fights.  Injuries.  Burns.  Damage to certain organs.  Having a baby with birth defects. ONE DRINK CAN BE TOO MUCH WHEN YOU ARE:  Working.  Pregnant or breastfeeding.  Taking medicines. Ask your doctor.  Driving or planning to drive. If you or someone you know has a drinking problem, get help from a doctor.  Document Released: 12/24/2008 Document Revised: 05/22/2011 Document Reviewed: 12/24/2008 ExitCare Patient Information 2013 [employer].   Smoking Hazards Smoking cigarettes is extremely bad for your health. Tobacco smoke has over 200 known poisons in it. There are over 60 chemicals in tobacco smoke that cause cancer. Some of the chemicals found in cigarette smoke include:   Cyanide.  Benzene.  Formaldehyde.  Methanol (wood alcohol).  Acetylene (fuel used in welding torches).  Ammonia. Cigarette smoke also contains the poisonous gases nitrogen oxide and carbon monoxide.  Cigarette smokers have an increased risk of many serious medical problems and Smoking causes approximately:  90% of all lung cancer deaths in men.  80% of all lung cancer deaths in women.  90% of deaths from chronic obstructive lung disease. Compared with nonsmokers, smoking increases the risk of:  Coronary heart disease by 2 to 4 times.  Stroke by 2 to 4 times.  Men developing lung cancer by 23 times.  Women developing lung cancer by 13 times.  Dying from chronic obstructive lung diseases by 12 times.  . Smoking is the most preventable cause of death and disease in our society.  WHY IS SMOKING ADDICTIVE?  Nicotine is the chemical  agent in tobacco that is capable of causing addiction or dependence.  When you smoke and inhale, nicotine is absorbed rapidly into the bloodstream through your lungs. Nicotine absorbed through the lungs is capable of creating a powerful addiction. Both inhaled and non-inhaled nicotine may be addictive.  Addiction studies of cigarettes and spit tobacco show that addiction to nicotine occurs mainly during the teen years, when young people begin using tobacco products. WHAT ARE THE BENEFITS OF QUITTING?  There are many health benefits to quitting smoking.   Likelihood of developing cancer and heart disease decreases. Health improvements are seen almost immediately.  Blood pressure, pulse rate, and breathing patterns start returning to normal soon after quitting. QUITTING SMOKING   American Lung Association - 1-800-LUNGUSA  American Cancer Society - 1-800-ACS-2345 Document Released: 04/06/2004 Document Revised: 05/22/2011 Document Reviewed: 12/09/2008 ExitCare Patient Information 2013 ExitCare[employer]   LLC.   Stress Management Stress is a state of physical or mental tension that often results from changes in your life or normal routine. Some common causes of stress are:  Death of a loved one.  Injuries or severe illnesses.  Getting fired or changing jobs.  Moving into a new home. Other causes may be:  Sexual problems.  Business or financial losses.  Taking on a large debt.  Regular conflict with someone at home or at work.  Constant tiredness from lack of sleep. It is not just bad things that are stressful. It may be stressful to:  Win the lottery.  Get married.  Buy a new car. The amount of stress that can be easily tolerated varies from person to person. Changes generally cause stress, regardless of the types of change. Too much stress can affect your health. It may lead to physical or emotional problems. Too little stress (boredom) may also become stressful. SUGGESTIONS TO  REDUCE STRESS:  Talk things over with your family and friends. It often is helpful to share your concerns and worries. If you feel your problem is serious, you may want to get help from a professional counselor.  Consider your problems one at a time instead of lumping them all together. Trying to take care of everything at once may seem impossible. List all the things you need to do and then start with the most important one. Set a goal to accomplish 2 or 3 things each day. If you expect to do too many in a single day you will naturally fail, causing you to feel even more stressed.  Do not use alcohol or drugs to relieve stress. Although you may feel better for a short time, they do not remove the problems that caused the stress. They can also be habit forming.  Exercise regularly - at least 3 times per week. Physical exercise can help to relieve that "uptight" feeling and will relax you.  The shortest distance between despair and hope is often a good night's sleep.  Go to bed and get up on time allowing yourself time for appointments without being rushed.  Take a short "time-out" period from any stressful situation that occurs during the day. Close your eyes and take some deep breaths. Starting with the muscles in your face, tense them, hold it for a few seconds, then relax. Repeat this with the muscles in your neck, shoulders, hand, stomach, back and legs.  Take good care of yourself. Eat a balanced diet and get plenty of rest.  Schedule time for having fun. Take a break from your daily routine to relax. HOME CARE INSTRUCTIONS   Call if you feel overwhelmed by your problems and feel you can no longer manage them on your own.  Return immediately if you feel like hurting yourself or someone else. Document Released: 08/23/2000 Document Revised: 05/22/2011 Document Reviewed: 04/15/2007 ExitCare Patient Information 2013 [employer]Care[employer].   

## 2015-06-24 NOTE — Progress Notes (Signed)
30 y.o. G0P0000 Single white female here for aex . Periods monthly but light with Mirena IUD. Happy with choice. No partner change, no Std screening needed. Sees MD for depression medication management. All stable at present. Sees PCP if needed. Traveling with job now. No other health issues today. Fostering dogs now!  Patient's last menstrual period was 05/12/2015.          Sexually active: Yes.    The current method of family planning is IUD.    Exercising: Yes.    biking Smoker:  no  Health Maintenance: Pap:  06-11-13 neg MMG:  none Colonoscopy:  none BMD:   none TDaP:  2012 Shingles: no Pneumonia: no Hep C and HIV: HIV neg 2015 Labs: none Self breast exam: done occ   reports that she has never smoked. She does not have any smokeless tobacco history on file. She reports that she drinks about 2.4 - 3.0 oz of alcohol per week. She reports that she does not use illicit drugs.  History reviewed. No pertinent past medical history.  Past Surgical History  Procedure Laterality Date  . Cyst removal hand Left     age 65  . Tonsillectomy      age 32  . Intrauterine device (iud) insertion  03/05/15    Mirena    Current Outpatient Prescriptions  Medication Sig Dispense Refill  . ALPRAZolam (XANAX) 0.25 MG tablet as needed. Reported on 03/05/2015    . cetirizine (ZYRTEC) 10 MG tablet Take 10 mg by mouth daily.    Marland Kitchen FLUoxetine (PROZAC) 40 MG capsule Take 1 capsule by mouth daily.  5  . fluticasone (FLONASE) 50 MCG/ACT nasal spray Place into both nostrils daily. Reported on 03/05/2015    . levonorgestrel (MIRENA) 20 MCG/24HR IUD 1 each by Intrauterine route once. Inserted 03/05/15    . loratadine (CLARITIN) 10 MG tablet Take 10 mg by mouth daily.    . Multiple Vitamins-Minerals (MULTIVITAMIN PO) Take by mouth.     No current facility-administered medications for this visit.    Family History  Problem Relation Age of Onset  . Diabetes Maternal Grandmother     ROS:  Pertinent  items are noted in HPI.  Otherwise, a comprehensive ROS was negative.  Exam:   BP 100/64 mmHg  Pulse 70  Resp 16  Ht 5' 7.5" (1.715 m)  Wt 178 lb (80.74 kg)  BMI 27.45 kg/m2  LMP 05/12/2015 Height: 5' 7.5" (171.5 cm) Ht Readings from Last 3 Encounters:  06/24/15 5' 7.5" (1.715 m)  04/16/15 5' 7.25" (1.708 m)  03/05/15 5' 7.25" (1.708 m)    General appearance: alert, cooperative and appears stated age Head: Normocephalic, without obvious abnormality, atraumatic Neck: no adenopathy, supple, symmetrical, trachea midline and thyroid normal to inspection and palpation Lungs: clear to auscultation bilaterally Breasts: normal appearance, no masses or tenderness, No nipple retraction or dimpling, No nipple discharge or bleeding, No axillary or supraclavicular adenopathy Heart: regular rate and rhythm Abdomen: soft, non-tender; no masses,  no organomegaly Extremities: extremities normal, atraumatic, no cyanosis or edema Skin: Skin color, texture, turgor normal. No rashes or lesions Lymph nodes: Cervical, supraclavicular, and axillary nodes normal. No abnormal inguinal nodes palpated Neurologic: Grossly normal   Pelvic: External genitalia:  no lesions              Urethra:  normal appearing urethra with no masses, tenderness or lesions              Bartholin's and Skene's: normal  Vagina: normal appearing vagina with normal color and discharge, no lesions              Cervix: no cervical motion tenderness, no lesions, nulliparous appearance and iud string noted in cervical os slight spotting noted from cervix              Pap taken: No. Bimanual Exam:  Uterus:  normal size, contour, position, consistency, mobility, non-tender              Adnexa: normal adnexa and no mass, fullness, tenderness               Rectovaginal: Confirms               Anus:  normal appearance  Chaperone present: yes  A:  Well Woman with normal exam  Contraception Mirena IUD inserted  12/16  Depression with MD management  P:   Reviewed health and wellness pertinent to exam  Warning signs reviewed with IUD use and need to advise  Continue follow up with MD as indicated  Pap smear as above not taken   counseled on breast self exam, STD prevention, HIV risk factors and prevention, adequate intake of calcium and vitamin D, diet and exercise  return annually or prn  An After Visit Summary was printed and given to the patient.

## 2015-06-29 NOTE — Progress Notes (Signed)
Encounter reviewed Jill Jertson, MD   

## 2016-05-20 DIAGNOSIS — N390 Urinary tract infection, site not specified: Secondary | ICD-10-CM | POA: Diagnosis not present

## 2016-06-27 ENCOUNTER — Ambulatory Visit (INDEPENDENT_AMBULATORY_CARE_PROVIDER_SITE_OTHER): Payer: BLUE CROSS/BLUE SHIELD | Admitting: Certified Nurse Midwife

## 2016-06-27 ENCOUNTER — Encounter: Payer: Self-pay | Admitting: Certified Nurse Midwife

## 2016-06-27 VITALS — BP 108/68 | HR 68 | Resp 16 | Ht 67.25 in | Wt 183.0 lb

## 2016-06-27 DIAGNOSIS — Z124 Encounter for screening for malignant neoplasm of cervix: Secondary | ICD-10-CM | POA: Diagnosis not present

## 2016-06-27 DIAGNOSIS — Z3049 Encounter for surveillance of other contraceptives: Secondary | ICD-10-CM

## 2016-06-27 DIAGNOSIS — Z01419 Encounter for gynecological examination (general) (routine) without abnormal findings: Secondary | ICD-10-CM | POA: Diagnosis not present

## 2016-06-27 NOTE — Patient Instructions (Signed)

## 2016-06-27 NOTE — Progress Notes (Signed)
31 y.o. G0P0000 Single  Caucasian Fe here for annual exam. Periods scant to none with IUD, but no real period. Denies warning signs with IUD use. Uses condoms also for contraception and STD prevention. No partner change, no STD screening desired Treated for UTI at Urgent care, no issues. No health issues today.  Patient's last menstrual period was 10/12/2015 (exact date).          Sexually active: Yes.    The current method of family planning is IUD.    Exercising: Yes.    yoga & biking Smoker:  no  Health Maintenance: Pap:  06-11-13 neg History of Abnormal Pap: no MMG:  none Self Breast exams: occ Colonoscopy:  none BMD:   none TDaP:  2012 Shingles: no Pneumonia: no Hep C and HIV: HIV neg 2015 Labs: not done    reports that she has never smoked. She has never used smokeless tobacco. She reports that she drinks about 2.4 oz of alcohol per week . She reports that she does not use drugs.  History reviewed. No pertinent past medical history.  Past Surgical History:  Procedure Laterality Date  . CYST REMOVAL HAND Left    age 71  . INTRAUTERINE DEVICE (IUD) INSERTION  03/05/15   Mirena  . TONSILLECTOMY     age 41    Current Outpatient Prescriptions  Medication Sig Dispense Refill  . ALPRAZolam (XANAX) 0.25 MG tablet as needed. Reported on 03/05/2015    . fexofenadine (ALLEGRA) 180 MG tablet Take 180 mg by mouth daily.    Marland Kitchen FLUoxetine (PROZAC) 40 MG capsule Take 1 capsule by mouth daily.  5  . fluticasone (FLONASE) 50 MCG/ACT nasal spray Place into both nostrils daily. Reported on 03/05/2015    . levonorgestrel (MIRENA) 20 MCG/24HR IUD 1 each by Intrauterine route once. Inserted 03/05/15    . loratadine (CLARITIN) 10 MG tablet Take 10 mg by mouth daily.    . Multiple Vitamins-Minerals (MULTIVITAMIN PO) Take by mouth.     No current facility-administered medications for this visit.     Family History  Problem Relation Age of Onset  . Diabetes Maternal Grandmother     ROS:   Pertinent items are noted in HPI.  Otherwise, a comprehensive ROS was negative.  Exam:   BP 108/68   Pulse 68   Resp 16   Ht 5' 7.25" (1.708 m)   Wt 183 lb (83 kg)   LMP 10/12/2015 (Exact Date)   BMI 28.45 kg/m  Height: 5' 7.25" (170.8 cm) Ht Readings from Last 3 Encounters:  06/27/16 5' 7.25" (1.708 m)  06/24/15 5' 7.5" (1.715 m)  04/16/15 5' 7.25" (1.708 m)    General appearance: alert, cooperative and appears stated age Head: Normocephalic, without obvious abnormality, atraumatic Neck: no adenopathy, supple, symmetrical, trachea midline and thyroid normal to inspection and palpation Lungs: clear to auscultation bilaterally Breasts: normal appearance, no masses or tenderness, No nipple retraction or dimpling, No nipple discharge or bleeding, No axillary or supraclavicular adenopathy Heart: regular rate and rhythm Abdomen: soft, non-tender; no masses,  no organomegaly Extremities: extremities normal, atraumatic, no cyanosis or edema Skin: Skin color, texture, turgor normal. No rashes or lesions Lymph nodes: Cervical, supraclavicular, and axillary nodes normal. No abnormal inguinal nodes palpated Neurologic: Grossly normal   Pelvic: External genitalia:  no lesions              Urethra:  normal appearing urethra with no masses, tenderness or lesions  Bartholin's and Skene's: normal                 Vagina: normal appearing vagina with normal color and discharge, no lesions              Cervix: no cervical motion tenderness, no lesions and nulliparous appearance IUD string noted              Pap taken: Yes.   Bimanual Exam:  Uterus:  normal size, contour, position, consistency, mobility, non-tender              Adnexa: normal adnexa and no mass, fullness, tenderness               Rectovaginal: Confirms               Anus:  normal appearance  Chaperone present: yes  A:  Well Woman with normal exam  Contraception Mirena IUD   P:   Reviewed health and wellness  pertinent to exam  Warning signs of IUD given and need to advise  Pap smear: yes  counseled on breast self exam, STD prevention, HIV risk factors and prevention, adequate intake of calcium and vitamin D, diet and exercise  return annually or prn  An After Visit Summary was printed and given to the patient.

## 2016-06-29 LAB — IPS PAP TEST WITH HPV

## 2016-07-01 NOTE — Progress Notes (Signed)
Encounter reviewed Graylin Sperling, MD   

## 2016-08-03 DIAGNOSIS — Z5181 Encounter for therapeutic drug level monitoring: Secondary | ICD-10-CM | POA: Diagnosis not present

## 2016-08-03 DIAGNOSIS — F419 Anxiety disorder, unspecified: Secondary | ICD-10-CM | POA: Diagnosis not present

## 2016-08-03 DIAGNOSIS — B070 Plantar wart: Secondary | ICD-10-CM | POA: Diagnosis not present

## 2016-08-03 DIAGNOSIS — F341 Dysthymic disorder: Secondary | ICD-10-CM | POA: Diagnosis not present

## 2016-08-03 DIAGNOSIS — Z13220 Encounter for screening for lipoid disorders: Secondary | ICD-10-CM | POA: Diagnosis not present

## 2017-01-22 DIAGNOSIS — Z20818 Contact with and (suspected) exposure to other bacterial communicable diseases: Secondary | ICD-10-CM | POA: Diagnosis not present

## 2017-01-22 DIAGNOSIS — F419 Anxiety disorder, unspecified: Secondary | ICD-10-CM | POA: Diagnosis not present

## 2017-01-22 DIAGNOSIS — J029 Acute pharyngitis, unspecified: Secondary | ICD-10-CM | POA: Diagnosis not present

## 2017-01-22 DIAGNOSIS — Z5181 Encounter for therapeutic drug level monitoring: Secondary | ICD-10-CM | POA: Diagnosis not present

## 2017-06-28 ENCOUNTER — Other Ambulatory Visit: Payer: Self-pay

## 2017-06-28 ENCOUNTER — Ambulatory Visit: Payer: BLUE CROSS/BLUE SHIELD | Admitting: Certified Nurse Midwife

## 2017-06-28 ENCOUNTER — Encounter: Payer: Self-pay | Admitting: Certified Nurse Midwife

## 2017-06-28 VITALS — BP 110/72 | HR 68 | Resp 16 | Ht 67.75 in | Wt 202.0 lb

## 2017-06-28 DIAGNOSIS — Z8659 Personal history of other mental and behavioral disorders: Secondary | ICD-10-CM

## 2017-06-28 DIAGNOSIS — Z30431 Encounter for routine checking of intrauterine contraceptive device: Secondary | ICD-10-CM

## 2017-06-28 DIAGNOSIS — Z01419 Encounter for gynecological examination (general) (routine) without abnormal findings: Secondary | ICD-10-CM

## 2017-06-28 DIAGNOSIS — E663 Overweight: Secondary | ICD-10-CM

## 2017-06-28 NOTE — Patient Instructions (Signed)

## 2017-06-28 NOTE — Progress Notes (Signed)
32 y.o. G0P0000 Single  Caucasian Fe here for annual exam. Periods none with Mirena, so happy with choice. No partner change or STD concerns. Dealing with allergies with OTC medications working fairly well. Aware she has gained weight, but not 21 pounds. Eats only lunch and dinner. Tries to eat well and exercises 3x weekly. Sees Dr.Wong for medication management for anxiety and labs were done and normal. No other health issues. [place]  No LMP recorded. (Menstrual status: IUD). inserted 2016          Sexually active: Yes.    The current method of family planning is IUD.    Exercising: Yes.    walking & biking Smoker:  no  Health Maintenance: Pap:  06-11-13 neg, 06-27-16 neg HPV HR neg History of Abnormal Pap: no MMG:  none Self Breast exams: yes Colonoscopy:  none BMD:   none TDaP:  2012 Shingles: no Pneumonia: no Hep C and HIV: HIV neg 2015 Labs: no   reports that she has never smoked. She has never used smokeless tobacco. She reports that she drinks about 2.4 oz of alcohol per week. She reports that she does not use drugs.  No past medical history on file.  Past Surgical History:  Procedure Laterality Date  . CYST REMOVAL HAND Left    age 32  . INTRAUTERINE DEVICE (IUD) INSERTION  03/05/15   Mirena  . TONSILLECTOMY     age 32    Current Outpatient Medications  Medication Sig Dispense Refill  . ALPRAZolam (XANAX) 0.25 MG tablet as needed. Reported on 03/05/2015    . fexofenadine (ALLEGRA) 180 MG tablet Take 180 mg by mouth daily.    Marland Kitchen. FLUoxetine (PROZAC) 40 MG capsule Take 1 capsule by mouth daily.  5  . fluticasone (FLONASE) 50 MCG/ACT nasal spray Place into both nostrils daily. Reported on 03/05/2015    . levonorgestrel (MIRENA) 20 MCG/24HR IUD 1 each by Intrauterine route once. Inserted 03/05/15    . loratadine (CLARITIN) 10 MG tablet Take 10 mg by mouth daily.    . Multiple Vitamins-Minerals (MULTIVITAMIN PO) Take by mouth.     No  current facility-administered medications for this visit.     Family History  Problem Relation Age of Onset  . Diabetes Maternal Grandmother   . Alzheimer's disease Maternal Grandmother     ROS:  Pertinent items are noted in HPI.  Otherwise, a comprehensive ROS was negative.  Exam:   There were no vitals taken for this visit.   Ht Readings from Last 3 Encounters:  06/27/16 5' 7.25" (1.708 m)  06/24/15 5' 7.5" (1.715 m)  04/16/15 5' 7.25" (1.708 m)    General appearance: alert, cooperative and appears stated age Head: Normocephalic, without obvious abnormality, atraumatic Neck: no adenopathy, supple, symmetrical, trachea midline and thyroid normal to inspection and palpation Lungs: clear to auscultation bilaterally Breasts: normal appearance, no masses or tenderness, No nipple retraction or dimpling, No nipple discharge or bleeding, No axillary or supraclavicular adenopathy Heart: regular rate and rhythm Abdomen: soft, non-tender; no masses,  no organomegaly Extremities: extremities normal, atraumatic, no cyanosis or edema Skin: Skin color, texture, turgor normal. No rashes or lesions Lymph nodes: Cervical, supraclavicular, and axillary nodes normal. No abnormal inguinal nodes palpated Neurologic: Grossly normal   Pelvic: External genitalia:  no lesions, normal female              Urethra:  normal appearing urethra with no masses, tenderness or lesions  Bartholin's and Skene's: normal                 Vagina: normal appearing vagina with normal color and discharge, no lesions              Cervix: no cervical motion tenderness, no lesions, nulliparous appearance and IUD string noted in cervix scant brown discharge noted( normal for her on occasion with IUD)              Pap taken: No. Bimanual Exam:  Uterus:  normal size, contour, position, consistency, mobility, non-tender and anteverted              Adnexa: normal adnexa and no mass, fullness, tenderness                Rectovaginal: Confirms               Anus:  normal sphincter tone, no lesions  Chaperone present: yes  A:  Well Woman with normal exam  Contraception Mirena IUD working well due for removal 03/04/20  Weight gain aware of gain and working with diet and exercise more now  Allergies with OTC use only  MD management of Prozac, no change  P:   Reviewed health and wellness pertinent to exam  Warning signs of IUD discussed aware to call if issues  Discussed ideas to work with weight and will continue to try  Continue MD follow up as indicated  Pap smear: no   counseled on breast self exam, STD prevention, HIV risk factors and prevention, adequate intake of calcium and vitamin D, diet and exercise  return annually or prn  An After Visit Summary was printed and given to the patient.

## 2017-07-23 DIAGNOSIS — G5762 Lesion of plantar nerve, left lower limb: Secondary | ICD-10-CM | POA: Diagnosis not present

## 2017-08-07 DIAGNOSIS — M79672 Pain in left foot: Secondary | ICD-10-CM | POA: Diagnosis not present

## 2017-08-09 ENCOUNTER — Ambulatory Visit: Payer: BLUE CROSS/BLUE SHIELD | Admitting: Podiatry

## 2017-11-11 DIAGNOSIS — F3181 Bipolar II disorder: Secondary | ICD-10-CM

## 2017-11-11 HISTORY — DX: Bipolar II disorder: F31.81

## 2017-12-24 DIAGNOSIS — F411 Generalized anxiety disorder: Secondary | ICD-10-CM | POA: Diagnosis not present

## 2017-12-24 DIAGNOSIS — F3181 Bipolar II disorder: Secondary | ICD-10-CM | POA: Diagnosis not present

## 2018-01-21 DIAGNOSIS — F3181 Bipolar II disorder: Secondary | ICD-10-CM | POA: Diagnosis not present

## 2018-01-21 DIAGNOSIS — F411 Generalized anxiety disorder: Secondary | ICD-10-CM | POA: Diagnosis not present

## 2018-02-25 DIAGNOSIS — F411 Generalized anxiety disorder: Secondary | ICD-10-CM | POA: Diagnosis not present

## 2018-02-25 DIAGNOSIS — F3181 Bipolar II disorder: Secondary | ICD-10-CM | POA: Diagnosis not present

## 2018-04-11 DIAGNOSIS — S339XXA Sprain of unspecified parts of lumbar spine and pelvis, initial encounter: Secondary | ICD-10-CM | POA: Diagnosis not present

## 2018-05-27 DIAGNOSIS — F411 Generalized anxiety disorder: Secondary | ICD-10-CM | POA: Diagnosis not present

## 2018-05-27 DIAGNOSIS — F3181 Bipolar II disorder: Secondary | ICD-10-CM | POA: Diagnosis not present

## 2018-07-16 ENCOUNTER — Ambulatory Visit: Payer: BLUE CROSS/BLUE SHIELD | Admitting: Certified Nurse Midwife

## 2018-09-03 DIAGNOSIS — F3181 Bipolar II disorder: Secondary | ICD-10-CM | POA: Diagnosis not present

## 2018-09-03 DIAGNOSIS — F411 Generalized anxiety disorder: Secondary | ICD-10-CM | POA: Diagnosis not present

## 2018-12-04 DIAGNOSIS — F3181 Bipolar II disorder: Secondary | ICD-10-CM | POA: Diagnosis not present

## 2018-12-04 DIAGNOSIS — F411 Generalized anxiety disorder: Secondary | ICD-10-CM | POA: Diagnosis not present

## 2019-04-24 DIAGNOSIS — J069 Acute upper respiratory infection, unspecified: Secondary | ICD-10-CM | POA: Diagnosis not present

## 2019-04-24 DIAGNOSIS — Z03818 Encounter for observation for suspected exposure to other biological agents ruled out: Secondary | ICD-10-CM | POA: Diagnosis not present

## 2019-05-07 NOTE — Progress Notes (Signed)
34 y.o. G0P0000 Single  Caucasian Fe here for annual exam. Periods none with Mirena IUD. Denies warning signs with IUD. Sees Psychiatrist for depression management and now has been diagnosed as Bipolar. Medications are working so much better now. Has medication check every 6 months. Sees PCP prn, no visits this year. Working from home, which is better for her. Planning Covid vaccination. Not sexually active since ending relationship. STD screening desired. No other health issues today.  No LMP recorded. (Menstrual status: IUD).          Sexually active: No.  The current method of family planning is IUD.    Exercising: Yes.    elliptical  Smoker:  no  Review of Systems  Constitutional: Negative.   HENT: Negative.   Eyes: Negative.   Respiratory: Negative.   Cardiovascular: Negative.   Gastrointestinal: Negative.   Genitourinary: Negative.   Musculoskeletal: Negative.   Skin: Negative.   Neurological: Negative.   Endo/Heme/Allergies: Negative.   Psychiatric/Behavioral: Negative.     Health Maintenance: Pap:  06-27-16 neg HPV HR neg History of Abnormal Pap: no MMG:  none Self Breast exams: yes Colonoscopy:  none BMD:   none TDaP:  2012 Shingles: no Pneumonia: no Hep C and HIV: HIV neg 2015 Labs: yes   reports that she has never smoked. She has never used smokeless tobacco. She reports current alcohol use of about 4.0 standard drinks of alcohol per week. She reports that she does not use drugs.  Past Medical History:  Diagnosis Date  . Bipolar 2 disorder (HCC) 11/11/2017    Past Surgical History:  Procedure Laterality Date  . CYST REMOVAL HAND Left    age 34  . INTRAUTERINE DEVICE (IUD) INSERTION  03/05/15   Mirena  . TONSILLECTOMY     age 34    Current Outpatient Medications  Medication Sig Dispense Refill  . ALPRAZolam (XANAX) 0.25 MG tablet as needed. Reported on 03/05/2015    . fexofenadine (ALLEGRA) 180 MG tablet Take 180 mg by mouth daily.    . fluticasone  (FLONASE) 50 MCG/ACT nasal spray Place into both nostrils daily. Reported on 03/05/2015    . lamoTRIgine (LAMICTAL) 150 MG tablet TAKE 1 TABLET BY MOUTH (IF OUT OF LAMICTAL MORE THAN 1 WK DO NOT RESTART, CALL MD) FOR 90 DAYS    . levonorgestrel (MIRENA) 20 MCG/24HR IUD 1 each by Intrauterine route once. Inserted 03/05/15    . loratadine (CLARITIN) 10 MG tablet Take 10 mg by mouth daily.    . Multiple Vitamins-Minerals (MULTIVITAMIN PO) Take by mouth.     No current facility-administered medications for this visit.    Family History  Problem Relation Age of Onset  . Diabetes Maternal Grandmother   . Alzheimer's disease Maternal Grandmother     ROS:  Pertinent items are noted in HPI.  Otherwise, a comprehensive ROS was negative.  Exam:   BP 110/74 (BP Location: Right Arm, Patient Position: Sitting, Cuff Size: Large)   Pulse 72   Temp 98.1 F (36.7 C) (Skin)   Resp 14   Ht 5' 7.75" (1.721 m)   Wt 197 lb 1.6 oz (89.4 kg)   BMI 30.19 kg/m  Height: 5' 7.75" (172.1 cm) Ht Readings from Last 3 Encounters:  05/12/19 5' 7.75" (1.721 m)  06/28/17 5' 7.75" (1.721 m)  06/27/16 5' 7.25" (1.708 m)    General appearance: alert, cooperative and appears stated age Head: Normocephalic, without obvious abnormality, atraumatic Neck: no adenopathy, supple, symmetrical, trachea midline and  thyroid normal to inspection and palpation Lungs: clear to auscultation bilaterally Breasts: normal appearance, no masses or tenderness, No nipple retraction or dimpling, No nipple discharge or bleeding, No axillary or supraclavicular adenopathy Heart: regular rate and rhythm Abdomen: soft, non-tender; no masses,  no organomegaly Extremities: extremities normal, atraumatic, no cyanosis or edema Skin: Skin color, texture, turgor normal. No rashes or lesions Lymph nodes: Cervical, supraclavicular, and axillary nodes normal. No abnormal inguinal nodes palpated Neurologic: Grossly normal   Pelvic: External  genitalia:  no lesions              Urethra:  normal appearing urethra with no masses, tenderness or lesions              Bartholin's and Skene's: normal                 Vagina: normal appearing vagina with normal color and discharge, no lesions              Cervix: no cervical motion tenderness, no lesions, nulliparous appearance and IUD string noted in cervix              Pap taken: Yes.   Bimanual Exam:  Uterus:  normal size, contour, position, consistency, mobility, non-tender and anteverted              Adnexa: normal adnexa and no mass, fullness, tenderness               Rectovaginal: Confirms               Anus:  normal sphincter tone, no lesions  Chaperone present: yes  A:  Well Woman with normal exam  Contraception Mirena IUD due for removal now 03/04/2021( due  to new recommendations of 6 year use)  History of Depression, new diagnosis of Bipolar , medication stable  STD screening  P:   Reviewed health and wellness pertinent to exam  Warning signs with IUD reviewed. Discussed new guidelines with Mirena lasting now 6 years, so removal will be one year later. Questions addressed.  Continue follow up with MD as indicated  Lab: HIV,RPR,Hep C, Gonorrhea, Chlamydia, Affirm  Pap smear: yes   counseled on breast self exam, STD prevention, HIV risk factors and prevention, feminine hygiene, family planning choices, adequate intake of calcium and vitamin D, diet and exercise  return annually or prn  An After Visit Summary was printed and given to the patient.

## 2019-05-08 ENCOUNTER — Other Ambulatory Visit: Payer: Self-pay

## 2019-05-12 ENCOUNTER — Other Ambulatory Visit: Payer: Self-pay

## 2019-05-12 ENCOUNTER — Encounter: Payer: Self-pay | Admitting: Certified Nurse Midwife

## 2019-05-12 ENCOUNTER — Ambulatory Visit: Payer: BC Managed Care – PPO | Admitting: Certified Nurse Midwife

## 2019-05-12 ENCOUNTER — Other Ambulatory Visit (HOSPITAL_COMMUNITY)
Admission: RE | Admit: 2019-05-12 | Discharge: 2019-05-12 | Disposition: A | Discharge status: Home / Self Care | Payer: BC Managed Care – PPO | Source: Ambulatory Visit | Attending: Obstetrics & Gynecology | Admitting: Obstetrics & Gynecology

## 2019-05-12 VITALS — BP 110/74 | HR 72 | Temp 98.1°F | Resp 14 | Ht 67.75 in | Wt 197.1 lb

## 2019-05-12 DIAGNOSIS — Z01419 Encounter for gynecological examination (general) (routine) without abnormal findings: Secondary | ICD-10-CM

## 2019-05-12 DIAGNOSIS — Z124 Encounter for screening for malignant neoplasm of cervix: Secondary | ICD-10-CM | POA: Insufficient documentation

## 2019-05-12 DIAGNOSIS — Z30431 Encounter for routine checking of intrauterine contraceptive device: Secondary | ICD-10-CM

## 2019-05-12 DIAGNOSIS — Z113 Encounter for screening for infections with a predominantly sexual mode of transmission: Secondary | ICD-10-CM | POA: Insufficient documentation

## 2019-05-12 DIAGNOSIS — Z975 Presence of (intrauterine) contraceptive device: Secondary | ICD-10-CM

## 2019-05-12 NOTE — Assessment & Plan Note (Signed)
Removal due 03/04/2021 ( new guidelines)

## 2019-05-12 NOTE — Patient Instructions (Signed)
General topics  Next pap or exam is  due in 1 year Take a Women's multivitamin Take 1200 mg. of calcium daily - prefer dietary If any concerns in interim to call back  Breast Self-Awareness Practicing breast self-awareness may pick up problems early, prevent significant medical complications, and possibly save your life. By practicing breast self-awareness, you can become familiar with how your breasts look and feel and if your breasts are changing. This allows you to notice changes early. It can also offer you some reassurance that your breast health is good. One way to learn what is normal for your breasts and whether your breasts are changing is to do a breast self-exam. If you find a lump or something that was not present in the past, it is best to contact your caregiver right away. Other findings that should be evaluated by your caregiver include nipple discharge, especially if it is bloody; skin changes or reddening; areas where the skin seems to be pulled in (retracted); or new lumps and bumps. Breast pain is seldom associated with cancer (malignancy), but should also be evaluated by a caregiver. BREAST SELF-EXAM The best time to examine your breasts is 5 7 days after your menstrual period is over.  ExitCare Patient Information 2013 [employer].   Exercise to Stay Healthy Exercise helps you become and stay healthy. EXERCISE IDEAS AND TIPS Choose exercises that:  You enjoy.  Fit into your day. You do not need to exercise really hard to be healthy. You can do exercises at a slow or medium level and stay healthy. You can:  Stretch before and after working out.  Try yoga, Pilates, or tai chi.  Lift weights.  Walk fast, swim, jog, run, climb stairs, bicycle, dance, or rollerskate.  Take aerobic classes. Exercises that burn about 150 calories:  Running 1  miles in 15 minutes.  Playing volleyball for 45 to 60 minutes.  Washing and waxing a car for 45 to 60  minutes.  Playing touch football for 45 minutes.  Walking 1  miles in 35 minutes.  Pushing a stroller 1  miles in 30 minutes.  Playing basketball for 30 minutes.  Raking leaves for 30 minutes.  Bicycling 5 miles in 30 minutes.  Walking 2 miles in 30 minutes.  Dancing for 30 minutes.  Shoveling snow for 15 minutes.  Swimming laps for 20 minutes.  Walking up stairs for 15 minutes.  Bicycling 4 miles in 15 minutes.  Gardening for 30 to 45 minutes.  Jumping rope for 15 minutes.  Washing windows or floors for 45 to 60 minutes. Document Released: 04/01/2010 Document Revised: 05/22/2011 Document Reviewed: 04/01/2010 [facility] Patient Information 2013 [place].   Other topics ( that may be useful information):    Sexually Transmitted Disease Sexually transmitted disease (STD) refers to any infection that is passed from person to person during sexual activity. This may happen by way of saliva, semen, blood, vaginal mucus, or urine. Common STDs include:  Gonorrhea.  Chlamydia.  Syphilis.  HIV/AIDS.  Genital herpes.  Hepatitis B and C.  Trichomonas.  Human papillomavirus (HPV).  Pubic lice. CAUSES  An STD may be spread by bacteria, virus, or parasite. A person can get an STD by:  Sexual intercourse with an infected person.  Sharing sex toys with an infected person.  Sharing needles with an infected person.  Having intimate contact with the genitals, mouth, or rectal areas of an infected person. SYMPTOMS  Some people may not have any symptoms, but  they can still pass the infection to others. Different STDs have different symptoms. Symptoms include:  Painful or bloody urination.  Pain in the pelvis, abdomen, vagina, anus, throat, or eyes.  Skin rash, itching, irritation, growths, or sores (lesions). These usually occur in the genital or anal area.  Abnormal vaginal discharge.  Penile discharge in men.  Soft, flesh-colored skin growths in the  genital or anal area.  Fever.  Pain or bleeding during sexual intercourse.  Swollen glands in the groin area.  Yellow skin and eyes (jaundice). This is seen with hepatitis. DIAGNOSIS  To make a diagnosis, your caregiver may:  Take a medical history.  Perform a physical exam.  Take a specimen (culture) to be examined.  Examine a sample of discharge under a microscope.  Perform blood test TREATMENT   Chlamydia, gonorrhea, trichomonas, and syphilis can be cured with antibiotic medicine.  Genital herpes, hepatitis, and HIV can be treated, but not cured, with prescribed medicines. The medicines will lessen the symptoms.  Genital warts from HPV can be treated with medicine or by freezing, burning (electrocautery), or surgery. Warts may come back.  HPV is a virus and cannot be cured with medicine or surgery.However, abnormal areas may be followed very closely by your caregiver and may be removed from the cervix, vagina, or vulva through office procedures or surgery. If your diagnosis is confirmed, your recent sexual partners need treatment. This is true even if they are symptom-free or have a negative culture or evaluation. They should not have sex until their caregiver says it is okay. HOME CARE INSTRUCTIONS  All sexual partners should be informed, tested, and treated for all STDs.  Take your antibiotics as directed. Finish them even if you start to feel better.  Only take over-the-counter or prescription medicines for pain, discomfort, or fever as directed by your caregiver.  Rest.  Eat a balanced diet and drink enough fluids to keep your urine clear or pale yellow.  Do not have sex until treatment is completed and you have followed up with your caregiver. STDs should be checked after treatment.  Keep all follow-up appointments, Pap tests, and blood tests as directed by your caregiver.  Only use latex condoms and water-soluble lubricants during sexual activity. Do not use  petroleum jelly or oils.  Avoid alcohol and illegal drugs.  Get vaccinated for HPV and hepatitis. If you have not received these vaccines in the past, talk to your caregiver about whether one or both might be right for you.  Avoid risky sex practices that can break the skin. The only way to avoid getting an STD is to avoid all sexual activity.Latex condoms and dental dams (for oral sex) will help lessen the risk of getting an STD, but will not completely eliminate the risk. SEEK MEDICAL CARE IF:   You have a fever.  You have any new or worsening symptoms. Document Released: 05/20/2002 Document Revised: 05/22/2011 Document Reviewed: 05/27/2010 [facility] Patient Information 2013 [place].    Domestic Abuse You are being battered or abused if someone close to you hits, pushes, or physically hurts you in any way. You also are being abused if you are forced into activities. You are being sexually abused if you are forced to have sexual contact of any kind. You are being emotionally abused if you are made to feel worthless or if you are constantly threatened. It is important to remember that help is available. No one has the right to abuse you. PREVENTION OF FURTHER  ABUSE  Learn the warning signs of danger. This varies with situations but may include: the use of alcohol, threats, isolation from friends and family, or forced sexual contact. Leave if you feel that violence is going to occur.  If you are attacked or beaten, report it to the police so the abuse is documented. You do not have to press charges. The police can protect you while you or the attackers are leaving. Get the officer's name and badge number and a copy of the report.  Find someone you can trust and tell them what is happening to you: your caregiver, a nurse, clergy member, close friend or family member. Feeling ashamed is natural, but remember that you have done nothing wrong. No one deserves abuse. Document Released:  02/25/2000 Document Revised: 05/22/2011 Document Reviewed: 05/05/2010 ExitCare Patient Information 2013 [employer].    How Much is Too Much Alcohol? Drinking too much alcohol can cause injury, accidents, and health problems. These types of problems can include:   Car crashes.  Falls.  Family fighting (domestic violence).  Drowning.  Fights.  Injuries.  Burns.  Damage to certain organs.  Having a baby with birth defects. ONE DRINK CAN BE TOO MUCH WHEN YOU ARE:  Working.  Pregnant or breastfeeding.  Taking medicines. Ask your doctor.  Driving or planning to drive. If you or someone you know has a drinking problem, get help from a doctor.  Document Released: 12/24/2008 Document Revised: 05/22/2011 Document Reviewed: 12/24/2008 ExitCare Patient Information 2013 [employer].   Smoking Hazards Smoking cigarettes is extremely bad for your health. Tobacco smoke has over 200 known poisons in it. There are over 60 chemicals in tobacco smoke that cause cancer. Some of the chemicals found in cigarette smoke include:   Cyanide.  Benzene.  Formaldehyde.  Methanol (wood alcohol).  Acetylene (fuel used in welding torches).  Ammonia. Cigarette smoke also contains the poisonous gases nitrogen oxide and carbon monoxide.  Cigarette smokers have an increased risk of many serious medical problems and Smoking causes approximately:  90% of all lung cancer deaths in men.  80% of all lung cancer deaths in women.  90% of deaths from chronic obstructive lung disease. Compared with nonsmokers, smoking increases the risk of:  Coronary heart disease by 2 to 4 times.  Stroke by 2 to 4 times.  Men developing lung cancer by 23 times.  Women developing lung cancer by 13 times.  Dying from chronic obstructive lung diseases by 12 times.  . Smoking is the most preventable cause of death and disease in our society.  WHY IS SMOKING ADDICTIVE?  Nicotine is the chemical  agent in tobacco that is capable of causing addiction or dependence.  When you smoke and inhale, nicotine is absorbed rapidly into the bloodstream through your lungs. Nicotine absorbed through the lungs is capable of creating a powerful addiction. Both inhaled and non-inhaled nicotine may be addictive.  Addiction studies of cigarettes and spit tobacco show that addiction to nicotine occurs mainly during the teen years, when young people begin using tobacco products. WHAT ARE THE BENEFITS OF QUITTING?  There are many health benefits to quitting smoking.   Likelihood of developing cancer and heart disease decreases. Health improvements are seen almost immediately.  Blood pressure, pulse rate, and breathing patterns start returning to normal soon after quitting. QUITTING SMOKING   American Lung Association - 1-800-LUNGUSA  American Cancer Society - 1-800-ACS-2345 Document Released: 04/06/2004 Document Revised: 05/22/2011 Document Reviewed: 12/09/2008 ExitCare Patient Information 2013 ExitCare,   LLC.   Stress Management Stress is a state of physical or mental tension that often results from changes in your life or normal routine. Some common causes of stress are:  Death of a loved one.  Injuries or severe illnesses.  Getting fired or changing jobs.  Moving into a new home. Other causes may be:  Sexual problems.  Business or financial losses.  Taking on a large debt.  Regular conflict with someone at home or at work.  Constant tiredness from lack of sleep. It is not just bad things that are stressful. It may be stressful to:  Win the lottery.  Get married.  Buy a new car. The amount of stress that can be easily tolerated varies from person to person. Changes generally cause stress, regardless of the types of change. Too much stress can affect your health. It may lead to physical or emotional problems. Too little stress (boredom) may also become stressful. SUGGESTIONS TO  REDUCE STRESS:  Talk things over with your family and friends. It often is helpful to share your concerns and worries. If you feel your problem is serious, you may want to get help from a professional counselor.  Consider your problems one at a time instead of lumping them all together. Trying to take care of everything at once may seem impossible. List all the things you need to do and then start with the most important one. Set a goal to accomplish 2 or 3 things each day. If you expect to do too many in a single day you will naturally fail, causing you to feel even more stressed.  Do not use alcohol or drugs to relieve stress. Although you may feel better for a short time, they do not remove the problems that caused the stress. They can also be habit forming.  Exercise regularly - at least 3 times per week. Physical exercise can help to relieve that "uptight" feeling and will relax you.  The shortest distance between despair and hope is often a good night's sleep.  Go to bed and get up on time allowing yourself time for appointments without being rushed.  Take a short "time-out" period from any stressful situation that occurs during the day. Close your eyes and take some deep breaths. Starting with the muscles in your face, tense them, hold it for a few seconds, then relax. Repeat this with the muscles in your neck, shoulders, hand, stomach, back and legs.  Take good care of yourself. Eat a balanced diet and get plenty of rest.  Schedule time for having fun. Take a break from your daily routine to relax. HOME CARE INSTRUCTIONS   Call if you feel overwhelmed by your problems and feel you can no longer manage them on your own.  Return immediately if you feel like hurting yourself or someone else. Document Released: 08/23/2000 Document Revised: 05/22/2011 Document Reviewed: 04/15/2007 ExitCare Patient Information 2013 ExitCare, [employer].   

## 2019-05-13 LAB — HEPATITIS C ANTIBODY: Hep C Virus Ab: <0.1 s/co ratio (ref 0.0–0.9)

## 2019-05-13 LAB — CYTOLOGY - PAP
Chlamydia: NEGATIVE
Comment: NEGATIVE
Comment: NORMAL
Diagnosis: NEGATIVE
Neisseria Gonorrhea: NEGATIVE

## 2019-05-13 LAB — VAGINITIS/VAGINOSIS, DNA PROBE
Candida Species: NEGATIVE
Gardnerella vaginalis: NEGATIVE
Trichomonas vaginosis: NEGATIVE

## 2019-05-13 LAB — HIV ANTIBODY (ROUTINE TESTING W REFLEX): HIV Screen 4th Generation wRfx: NONREACTIVE

## 2019-05-13 LAB — RPR: RPR Ser Ql: NONREACTIVE

## 2019-05-30 ENCOUNTER — Encounter: Payer: Self-pay | Admitting: Certified Nurse Midwife

## 2019-07-01 DIAGNOSIS — F3181 Bipolar II disorder: Secondary | ICD-10-CM | POA: Diagnosis not present

## 2019-07-01 DIAGNOSIS — F411 Generalized anxiety disorder: Secondary | ICD-10-CM | POA: Diagnosis not present

## 2019-08-14 DIAGNOSIS — L0291 Cutaneous abscess, unspecified: Secondary | ICD-10-CM | POA: Diagnosis not present

## 2019-09-01 ENCOUNTER — Ambulatory Visit (INDEPENDENT_AMBULATORY_CARE_PROVIDER_SITE_OTHER): Payer: Self-pay | Admitting: Sports Medicine

## 2019-09-01 ENCOUNTER — Ambulatory Visit: Payer: BC Managed Care – PPO

## 2019-09-01 ENCOUNTER — Ambulatory Visit
Admission: RE | Admit: 2019-09-01 | Discharge: 2019-09-01 | Disposition: A | Discharge status: Home / Self Care | Payer: Self-pay | Source: Ambulatory Visit | Attending: Sports Medicine | Admitting: Sports Medicine

## 2019-09-01 ENCOUNTER — Other Ambulatory Visit: Payer: Self-pay

## 2019-09-01 VITALS — BP 120/60 | Ht 67.0 in | Wt 190.0 lb

## 2019-09-01 DIAGNOSIS — R011 Cardiac murmur, unspecified: Secondary | ICD-10-CM | POA: Insufficient documentation

## 2019-09-01 DIAGNOSIS — Y9315 Activity, underwater diving and snorkeling: Secondary | ICD-10-CM

## 2019-09-01 DIAGNOSIS — Z0189 Encounter for other specified special examinations: Secondary | ICD-10-CM

## 2019-09-01 DIAGNOSIS — F3181 Bipolar II disorder: Secondary | ICD-10-CM | POA: Insufficient documentation

## 2019-09-01 LAB — GLUCOSE, POCT (MANUAL RESULT ENTRY): POC Glucose: 99 mg/dl (ref 70–99)

## 2019-09-01 LAB — POCT UA - GLUCOSE/PROTEIN
Glucose, UA: NEGATIVE
Protein, UA: NEGATIVE

## 2019-09-01 LAB — POCT HEMOGLOBIN: Hemoglobin: 13.2 g/dL (ref 11–14.6)

## 2019-09-01 NOTE — Assessment & Plan Note (Signed)
Full physical exam with appropriate ancillary testing performed and reviewed   cleared for diving not restriction

## 2019-09-01 NOTE — Progress Notes (Signed)
    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  He denies any physical limitations that would preclude her from diving, recently went diving in [place] with no concerns.  Medical history is only significant for bipolar 2 disorder which she says is stable and managed on the same medications long-term.  Denies any SI/HI.  confirms that medications in chart are now accurate  PERTINENT  PMH / PSH:   OBJECTIVE:   BP 120/60   Ht 5\' 7"  (1.702 m)   Wt 190 lb (86.2 kg)   BMI 29.76 kg/m   General: Alert and pleasant with no indication of medical distress Eyes : Clear sclera, PERRLA, EOMI intact, visual fields intact Ears: Tympanic membrane without effusion, no pain to manipulation of the pinna Pharynx: No indication of infection, status post tonsillectomy Cardiac: ECG reviewed with normal sinus rhythm, regular rate and rhythm to exam, 2/6 systolic murmur (new to patient), no edema Lungs: Clear to auscultation bilaterally, no cough, no wheeze, no increased work of breathing Abdomen: Soft with no tenderness to palpation Cranial nerve exam: No deficits to cranial nerve exam, strength in limbs intact, negative Romberg, good tandem/single/double leg balance test and good finger-to-nose   ASSESSMENT/PLAN:   Healthy 34 year old female  EKG, chest x-ray, physical exam, and necessary blood work are all unremarkable other than a 2/6 systolic murmur which sounds benign.  She is cleared to dive without restriction.  We did recommend that she follow-up with her PCP for further evaluation of the murmur heard on physical exam.   35, DO [facility]    Patient seen and evaluated with the resident.  I agree with the above plan of care.  Patient is cleared to dive without restriction.  She will see her PCP for possible work-up of the murmur heard on exam today.

## 2019-09-01 NOTE — Assessment & Plan Note (Addendum)
Full physical exam along with relevant ancillary testing performed and reviewed   cleared for driving without restriction

## 2019-09-03 ENCOUNTER — Encounter: Payer: Self-pay | Admitting: Sports Medicine

## 2019-09-25 DIAGNOSIS — F411 Generalized anxiety disorder: Secondary | ICD-10-CM | POA: Diagnosis not present

## 2019-09-25 DIAGNOSIS — F3181 Bipolar II disorder: Secondary | ICD-10-CM | POA: Diagnosis not present

## 2019-10-06 DIAGNOSIS — D1801 Hemangioma of skin and subcutaneous tissue: Secondary | ICD-10-CM | POA: Diagnosis not present

## 2019-10-06 DIAGNOSIS — L578 Other skin changes due to chronic exposure to nonionizing radiation: Secondary | ICD-10-CM | POA: Diagnosis not present

## 2019-10-06 DIAGNOSIS — L814 Other melanin hyperpigmentation: Secondary | ICD-10-CM | POA: Diagnosis not present

## 2019-10-06 DIAGNOSIS — L821 Other seborrheic keratosis: Secondary | ICD-10-CM | POA: Diagnosis not present

## 2019-10-17 ENCOUNTER — Ambulatory Visit (INDEPENDENT_AMBULATORY_CARE_PROVIDER_SITE_OTHER): Payer: BC Managed Care – PPO | Admitting: Physician Assistant

## 2019-10-17 ENCOUNTER — Encounter: Payer: Self-pay | Admitting: Physician Assistant

## 2019-10-17 ENCOUNTER — Other Ambulatory Visit: Payer: Self-pay

## 2019-10-17 VITALS — BP 110/70 | HR 68 | Temp 98.5°F | Resp 16 | Ht 67.0 in | Wt 192.0 lb

## 2019-10-17 DIAGNOSIS — F3181 Bipolar II disorder: Secondary | ICD-10-CM

## 2019-10-17 DIAGNOSIS — R011 Cardiac murmur, unspecified: Secondary | ICD-10-CM

## 2019-10-17 NOTE — Patient Instructions (Signed)
Keep your phone on. You will get a call to schedule your echocardiogram. Again this is just to be cautious. To me the murmur is very slight and most likely benign but rather be safe than sorry.   Ok to continue current activities. Follow-up with your specialists as scheduled.   It was very nice meeting you today. Welcome to [employer]!!

## 2019-10-17 NOTE — Progress Notes (Signed)
Patient presents to clinic today to establish care.  Acute Concerns: Patient notes that she recently had a scuba diving checkup and was told she had a heart murmur. Has never had issue with her heart before. Patient denies chest pain, palpitations, lightheadedness, dizziness, vision changes or frequent headaches. Patient had EKG, CXR and labs at that time, all unremarkable. Was recommended she follow-up with a PCP for further assessment. .   Chronic Issues: Bipolar II -- Followed by [facility]. Currently on Lamictal with good result. Denies breakthrough symptoms on current regimen.   Health Maintenance: Immunizations -- Records requested.  PAP -- Followed by GYN. [facility]  Past Medical History:  Diagnosis Date  . Bipolar 2 disorder (HCC) 11/11/2017   [facility]    Past Surgical History:  Procedure Laterality Date  . CYST REMOVAL HAND Left    age 64  . INTRAUTERINE DEVICE (IUD) INSERTION  03/05/15   Mirena  . TONSILLECTOMY     age 28    Current Outpatient Medications on File Prior to Visit  Medication Sig Dispense Refill  . ALPRAZolam (XANAX) 0.25 MG tablet as needed. Reported on 03/05/2015    . fluticasone (FLONASE) 50 MCG/ACT nasal spray Place into both nostrils daily. Reported on 03/05/2015    . lamoTRIgine (LAMICTAL) 150 MG tablet TAKE 1 TABLET BY MOUTH (IF OUT OF LAMICTAL MORE THAN 1 WK DO NOT RESTART, CALL MD) FOR 90 DAYS    . levonorgestrel (MIRENA) 20 MCG/24HR IUD 1 each by Intrauterine route once. Inserted 03/05/15    . loratadine (CLARITIN) 10 MG tablet Take 10 mg by mouth daily.    . Melatonin 10 MG TABS Take 1 tablet by mouth daily.    . Multiple Vitamins-Minerals (MULTIVITAMIN PO) Take by mouth.     No current facility-administered medications on file prior to visit.    Allergies  Allergen Reactions  . Keflex [Cephalexin] Hives  . Penicillin G   . Penicillins Hives    Family History  Problem Relation Age of Onset    . Diabetes Maternal Grandmother   . Alzheimer's disease Maternal Grandmother     Social History   Socioeconomic History  . Marital status: Single    Spouse name: Not on file  . Number of children: Not on file  . Years of education: Not on file  . Highest education level: Not on file  Occupational History  . Not on file  Tobacco Use  . Smoking status: Never Smoker  . Smokeless tobacco: Never Used  Substance and Sexual Activity  . Alcohol use: Yes    Alcohol/week: 4.0 standard drinks    Types: 4 Standard drinks or equivalent per week  . Drug use: No  . Sexual activity: Yes    Partners: Male    Birth control/protection: I.U.D.  Other Topics Concern  . Not on file  Social History Narrative  . Not on file   Social Determinants of Health   Financial Resource Strain:   . Difficulty of Paying Living Expenses:   Food Insecurity:   . Worried About Programme researcher, broadcasting/film/video in the Last Year:   . Barista in the Last Year:   Transportation Needs:   . Freight forwarder (Medical):   Marland Kitchen Lack of Transportation (Non-Medical):   Physical Activity:   . Days of Exercise per Week:   . Minutes of Exercise per Session:   Stress:   . Feeling of Stress :   Social  Connections:   . Frequency of Communication with Friends and Family:   . Frequency of Social Gatherings with Friends and Family:   . Attends Religious Services:   . Active Member of Clubs or Organizations:   . Attends Banker Meetings:   Marland Kitchen Marital Status:   Intimate Partner Violence:   . Fear of Current or Ex-Partner:   . Emotionally Abused:   Marland Kitchen Physically Abused:   . Sexually Abused:    ROS  Pertinent ROS are listed in the HPI.   Ht 5\' 7"  (1.702 m)   Wt 192 lb (87.1 kg)   BMI 30.07 kg/m   Physical Exam Vitals reviewed.  Constitutional:      Appearance: Normal appearance.  HENT:     Head: Normocephalic and atraumatic.  Cardiovascular:     Rate and Rhythm: Normal rate and regular rhythm.      Pulses: Normal pulses.     Heart sounds: Murmur (Grade II/VI systolic murmur) heard.   Pulmonary:     Effort: Pulmonary effort is normal.     Breath sounds: Normal breath sounds.  Musculoskeletal:     Cervical back: Neck supple.  Neurological:     General: No focal deficit present.     Mental Status: She is alert and oriented to person, place, and time.  Psychiatric:        Mood and Affect: Mood normal.     Recent Results (from the past 2160 hour(s))  POCT glucose (manual entry)     Status: None   Collection Time: 09/01/19  3:22 PM  Result Value Ref Range   POC Glucose 99 70 - 99 mg/dl  POCT hemoglobin     Status: None   Collection Time: 09/01/19  3:22 PM  Result Value Ref Range   Hemoglobin 13.2 11 - 14.6 g/dL  POCT UA - Glucose/Protein     Status: None   Collection Time: 09/01/19  3:28 PM  Result Value Ref Range   Glucose, UA Negative Negative   Protein, UA Negative Negative    Assessment/Plan: 1. Undiagnosed cardiac murmurs Noted by other provider at time of last physical. Negative EKG and CXR at that time. Murmur again auscultated today. Asymptomatic. Suspect benign flow murmur but will get Echo to further assess.  - ECHOCARDIOGRAM COMPLETE; Future  2. Bipolar 2 disorder [facility]) Patient doing well. Continue management per specialist.   This visit occurred during the SARS-CoV-2 public health emergency.  Safety protocols were in place, including screening questions prior to the visit, additional usage of staff PPE, and extensive cleaning of exam room while observing appropriate contact time as indicated for disinfecting solutions.     [facility], PA-C

## 2019-10-30 ENCOUNTER — Ambulatory Visit (HOSPITAL_COMMUNITY): Payer: BC Managed Care – PPO | Attending: Cardiology

## 2019-10-30 ENCOUNTER — Other Ambulatory Visit: Payer: Self-pay

## 2019-10-30 DIAGNOSIS — R011 Cardiac murmur, unspecified: Secondary | ICD-10-CM | POA: Diagnosis not present

## 2019-10-31 LAB — ECHOCARDIOGRAM COMPLETE
Area-P 1/2: 3.85 cm2
S' Lateral: 2.6 cm

## 2019-12-16 DIAGNOSIS — F411 Generalized anxiety disorder: Secondary | ICD-10-CM | POA: Diagnosis not present

## 2019-12-16 DIAGNOSIS — F3181 Bipolar II disorder: Secondary | ICD-10-CM | POA: Diagnosis not present

## 2020-02-10 DIAGNOSIS — F902 Attention-deficit hyperactivity disorder, combined type: Secondary | ICD-10-CM | POA: Diagnosis not present

## 2020-02-10 DIAGNOSIS — F411 Generalized anxiety disorder: Secondary | ICD-10-CM | POA: Diagnosis not present

## 2020-02-10 DIAGNOSIS — F3181 Bipolar II disorder: Secondary | ICD-10-CM | POA: Diagnosis not present

## 2020-02-26 DIAGNOSIS — Z309 Encounter for contraceptive management, unspecified: Secondary | ICD-10-CM | POA: Diagnosis not present

## 2020-02-26 DIAGNOSIS — Z3009 Encounter for other general counseling and advice on contraception: Secondary | ICD-10-CM | POA: Diagnosis not present

## 2020-03-31 DIAGNOSIS — F3181 Bipolar II disorder: Secondary | ICD-10-CM | POA: Diagnosis not present

## 2020-03-31 DIAGNOSIS — F988 Other specified behavioral and emotional disorders with onset usually occurring in childhood and adolescence: Secondary | ICD-10-CM | POA: Diagnosis not present

## 2020-04-06 DIAGNOSIS — Z113 Encounter for screening for infections with a predominantly sexual mode of transmission: Secondary | ICD-10-CM | POA: Diagnosis not present

## 2020-04-06 DIAGNOSIS — Z30433 Encounter for removal and reinsertion of intrauterine contraceptive device: Secondary | ICD-10-CM | POA: Diagnosis not present

## 2020-04-06 DIAGNOSIS — Z3202 Encounter for pregnancy test, result negative: Secondary | ICD-10-CM | POA: Diagnosis not present

## 2020-05-18 ENCOUNTER — Ambulatory Visit: Payer: BC Managed Care – PPO

## 2020-05-19 DIAGNOSIS — Z30431 Encounter for routine checking of intrauterine contraceptive device: Secondary | ICD-10-CM | POA: Diagnosis not present

## 2020-07-08 ENCOUNTER — Ambulatory Visit: Payer: BC Managed Care – PPO | Admitting: Obstetrics and Gynecology

## 2020-07-13 DIAGNOSIS — Z6828 Body mass index (BMI) 28.0-28.9, adult: Secondary | ICD-10-CM | POA: Diagnosis not present

## 2020-07-13 DIAGNOSIS — Z01419 Encounter for gynecological examination (general) (routine) without abnormal findings: Secondary | ICD-10-CM | POA: Diagnosis not present

## 2020-08-03 ENCOUNTER — Ambulatory Visit: Payer: BC Managed Care – PPO

## 2020-09-29 DIAGNOSIS — F988 Other specified behavioral and emotional disorders with onset usually occurring in childhood and adolescence: Secondary | ICD-10-CM | POA: Diagnosis not present

## 2021-07-12 IMAGING — CR DG CHEST 2V
2 series · 2 of 2 positions shown · non-contrast
Comparison: None.

CLINICAL DATA: 33-year-old female with physical exam.

EXAM:
CHEST - 2 VIEW

[w chest pa]
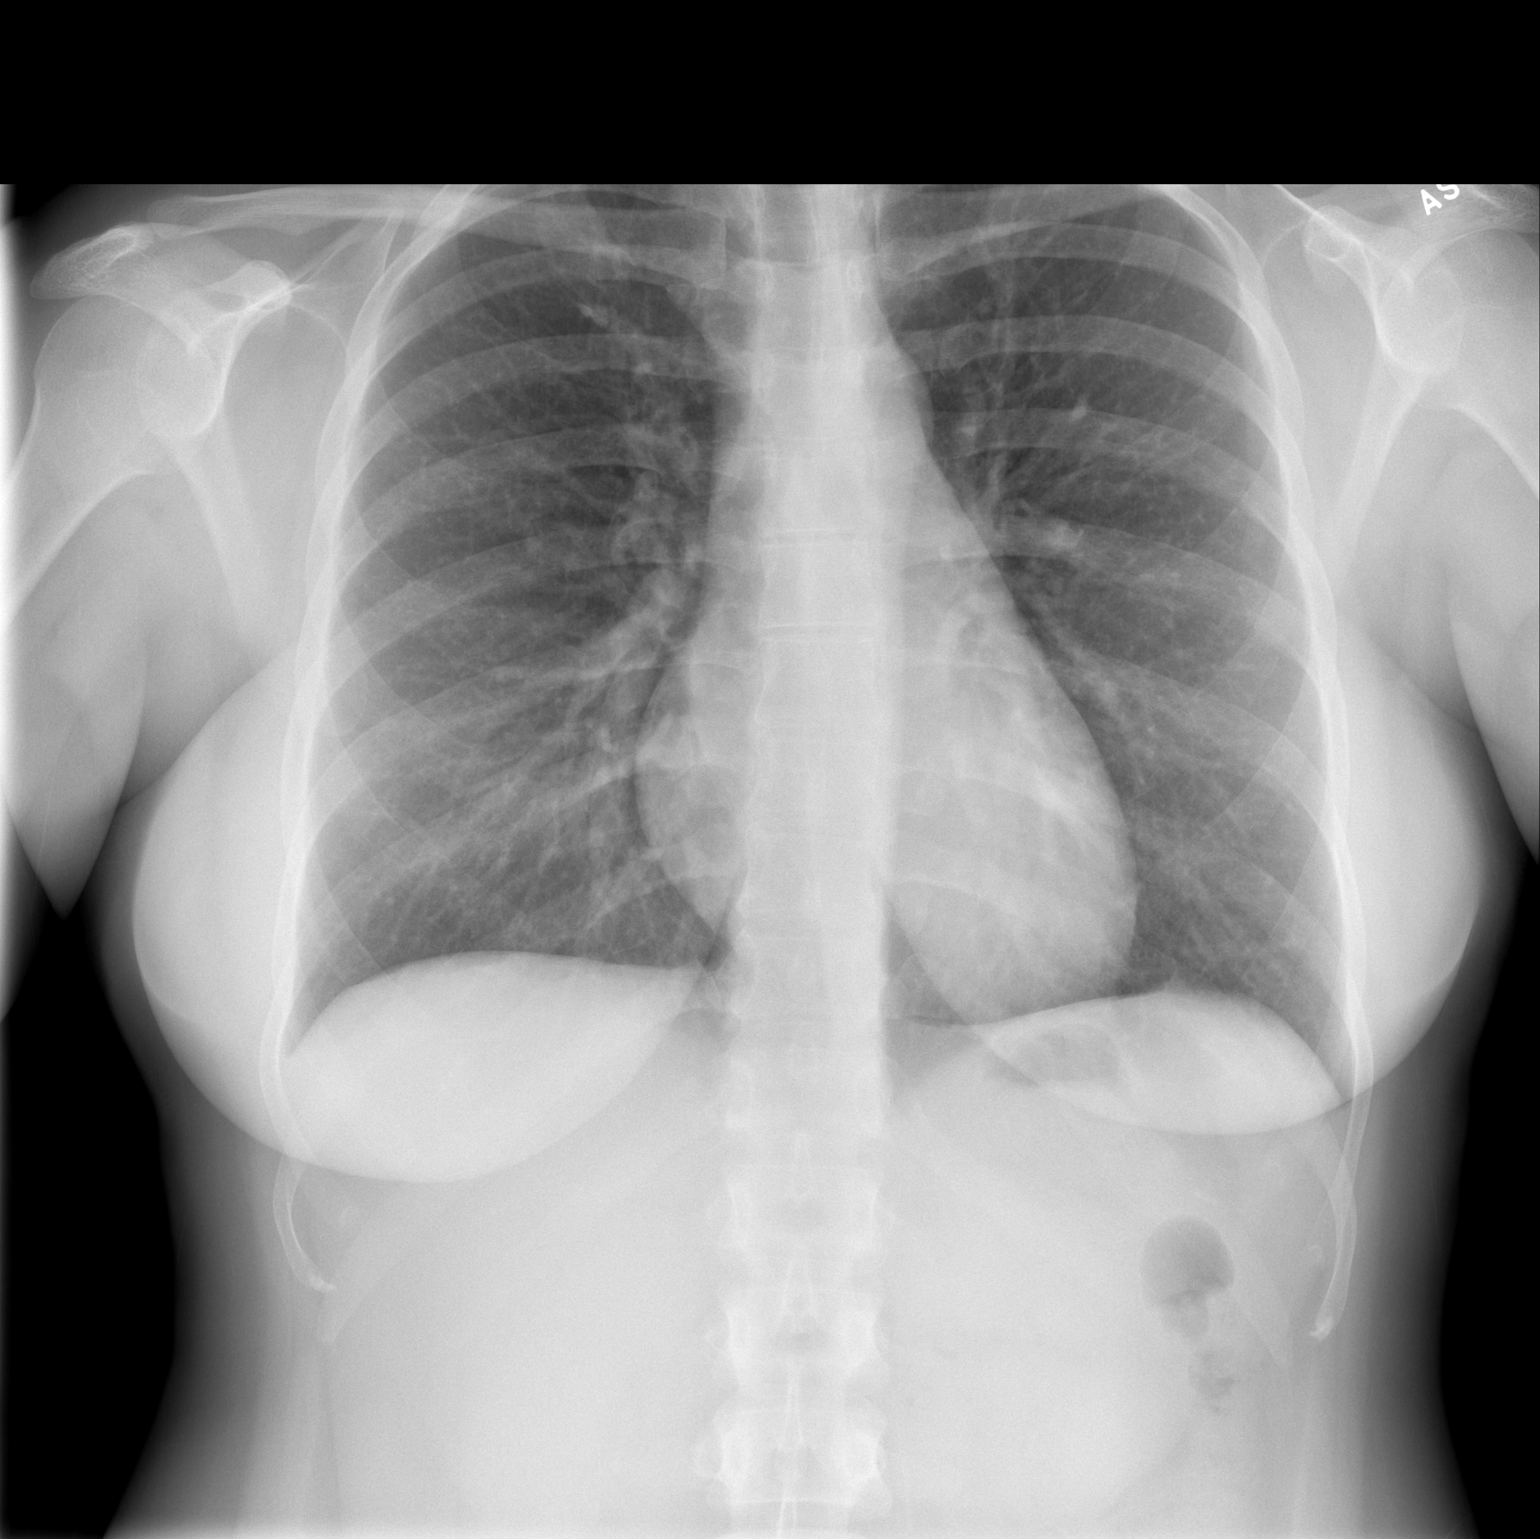

[w chest lat]
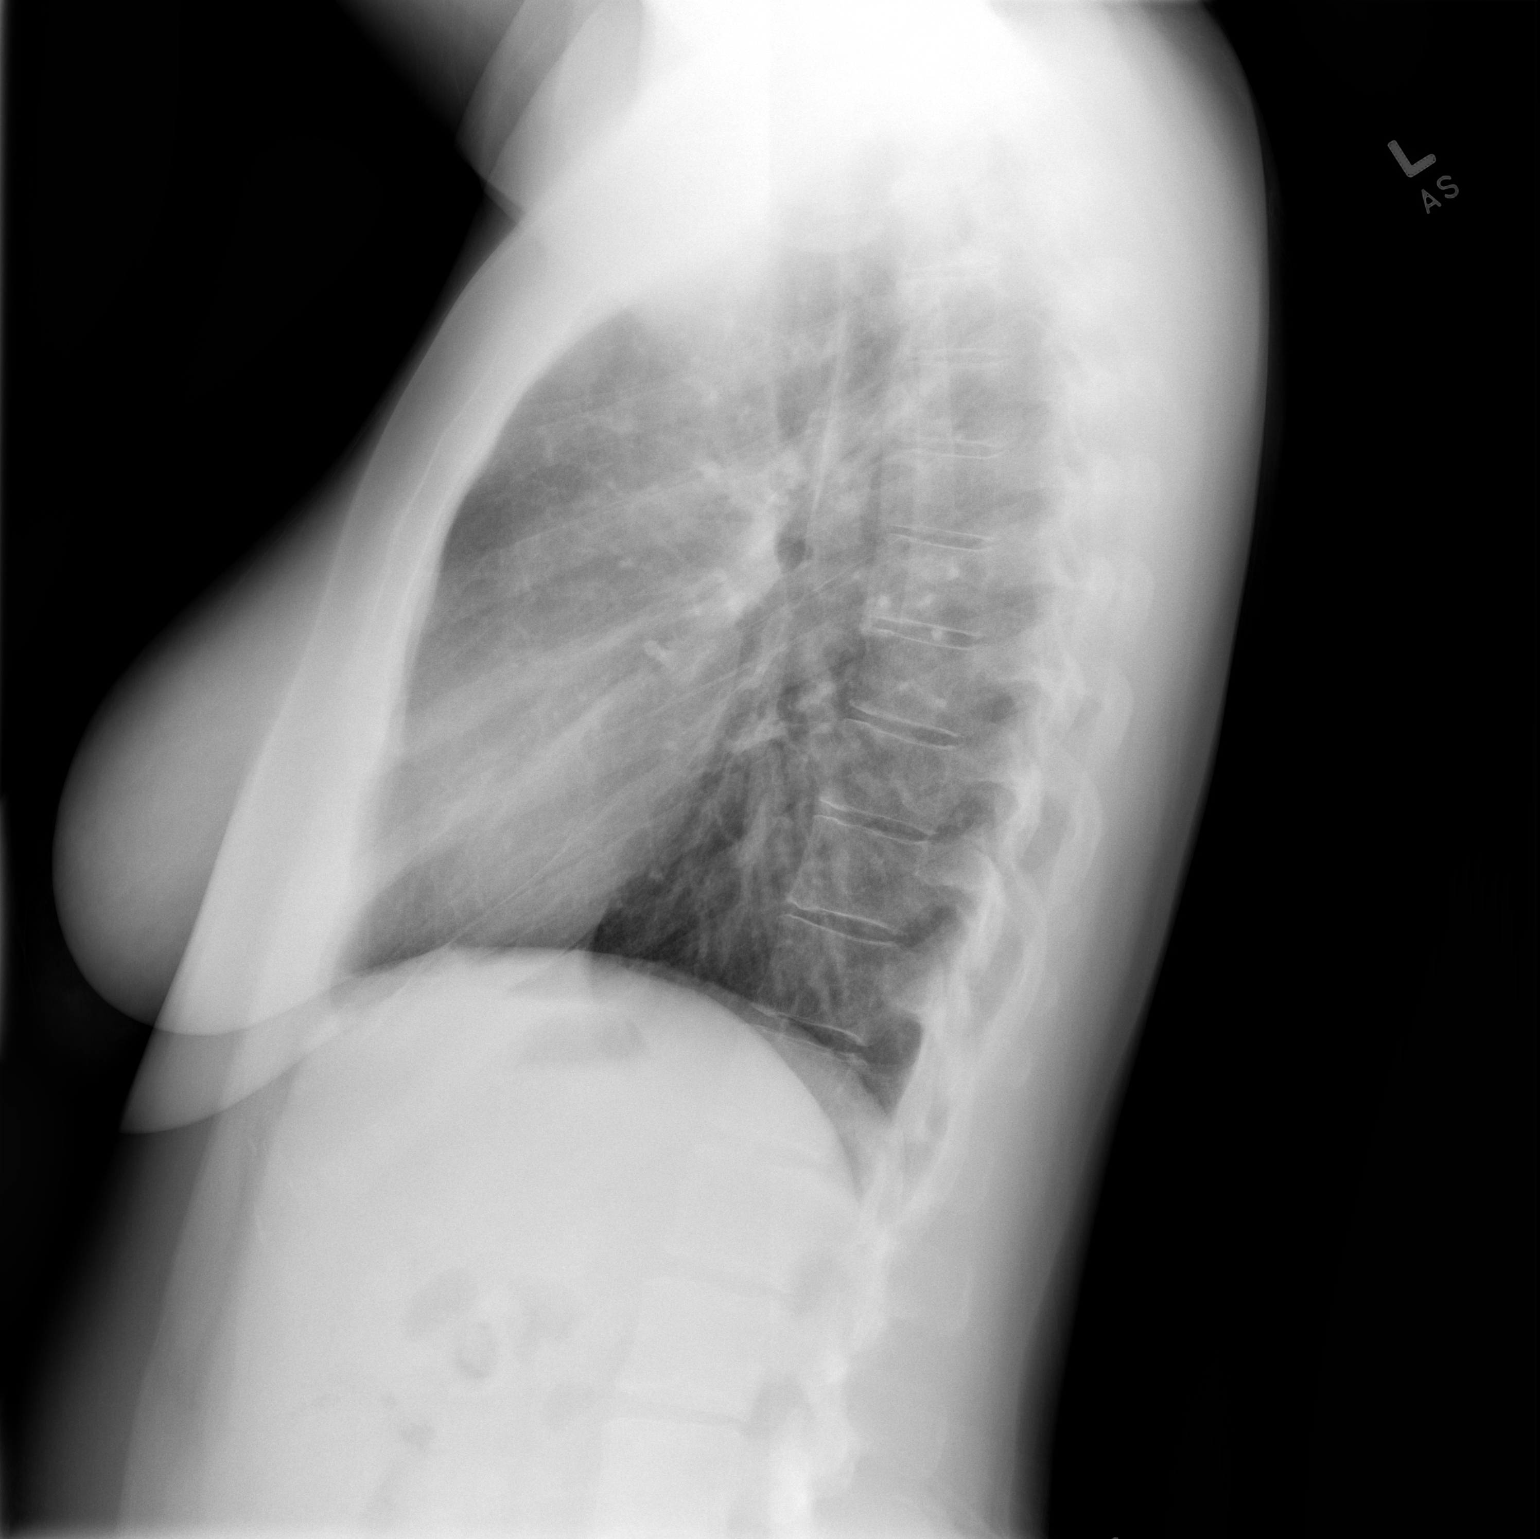

[2 of 2 positions shown; findings below may reference images not displayed]

FINDINGS: The heart size and mediastinal contours are within normal limits.
Both lungs are clear. The visualized skeletal structures are
unremarkable.
IMPRESSION: No active cardiopulmonary disease.

## 2023-08-30 ENCOUNTER — Ambulatory Visit (HOSPITAL_BASED_OUTPATIENT_CLINIC_OR_DEPARTMENT_OTHER): Admitting: Orthopaedic Surgery

## 2024-01-14 ENCOUNTER — Encounter: Payer: Self-pay | Admitting: Radiology
